# Patient Record
Sex: Male | Born: 1955
Health system: Southern US, Community
[De-identification: ages and names within clinical notes are randomized; demographics above are authoritative.]

## PROBLEM LIST (undated history)

## (undated) DIAGNOSIS — I10 Essential (primary) hypertension: Secondary | ICD-10-CM

## (undated) DIAGNOSIS — D649 Anemia, unspecified: Secondary | ICD-10-CM

## (undated) DIAGNOSIS — E119 Type 2 diabetes mellitus without complications: Secondary | ICD-10-CM

## (undated) HISTORY — DX: Essential (primary) hypertension: I10

---

## 2007-08-19 ENCOUNTER — Emergency Department (HOSPITAL_COMMUNITY): Admission: EM | Admit: 2007-08-19 | Discharge: 2007-08-19 | Payer: Self-pay | Admitting: Emergency Medicine

## 2010-02-15 ENCOUNTER — Emergency Department (HOSPITAL_COMMUNITY): Admission: EM | Admit: 2010-02-15 | Discharge: 2010-02-15 | Payer: Self-pay | Admitting: Emergency Medicine

## 2010-11-19 LAB — URINALYSIS, ROUTINE W REFLEX MICROSCOPIC
Bilirubin Urine: NEGATIVE
Glucose, UA: NEGATIVE mg/dL
Hgb urine dipstick: NEGATIVE
Ketones, ur: 15 mg/dL — AB
Leukocytes, UA: NEGATIVE
Nitrite: NEGATIVE
Protein, ur: 30 mg/dL — AB
Specific Gravity, Urine: 1.036 — ABNORMAL HIGH (ref 1.005–1.030)
Urobilinogen, UA: 0.2 mg/dL (ref 0.0–1.0)
pH: 6 (ref 5.0–8.0)

## 2010-11-19 LAB — URINE MICROSCOPIC-ADD ON

## 2012-09-29 ENCOUNTER — Encounter: Payer: Self-pay | Admitting: Dietician

## 2012-09-29 ENCOUNTER — Encounter: Payer: BC Managed Care – PPO | Attending: Internal Medicine | Admitting: Dietician

## 2012-09-29 DIAGNOSIS — Z713 Dietary counseling and surveillance: Secondary | ICD-10-CM | POA: Insufficient documentation

## 2012-09-29 DIAGNOSIS — I10 Essential (primary) hypertension: Secondary | ICD-10-CM | POA: Insufficient documentation

## 2012-09-29 DIAGNOSIS — E119 Type 2 diabetes mellitus without complications: Secondary | ICD-10-CM

## 2012-09-29 NOTE — Progress Notes (Signed)
Medical Nutrition Therapy:  Appt start time: 0800 end time:  0900.  Assessment:  Primary concerns today: Htn, DM type II.   MEDICATIONS: see list   DIETARY INTAKE:  Usual eating pattern includes 3 meals and 0-1 snacks per day.  Everyday foods include yams, plantains, chix, peanuts, oatmeal.  Avoided foods include pork, regular soda.    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 Pt states he enjoys walking very much.  Pt is [place], and has some trouble expressing his food choices, but seems to comprehend education well. He acknowledges some sweets in his diet, which he plans to remove (cookies and sweet bread). He also admits to uses a significant amount of salt in his cooking, and does eat some canned items, like corned beef hash, that are very sodium-rich. He also admits to eating very few fruits and vegetables, and has trepidations about  Eating more simply because is not aware of how to cook them, or which ones he will enjoy the taste of.  Pt states he feels very tired and sleepy right after meals, especially breakfast. He confirms c/o polyuria, thirst. He states this may be a very good time for him to begin walking.   Progress Towards Goal(s):  In progress.   Nutritional Diagnosis:  Inadequate vitamin D intake related to dark complexion, work shift during later day, inadequate dairy intake as evidenced by diet recall, 25-hydroxy Vitamin D 11.3 ng/mL.  Food and nutrition-related knowledge deficit related to DM type II and Htn eating pattern  as evidenced by poor glucose control (hgb A1c 8.4), elevated BP (174/98), and pt acknowledgement of lack of education on proper eating c these chronic illnesses.     Intervention:  Nutrition education provided regarding DM type II and Htn eating. Increased intake of nonstarchy veg emphasized, also portion control of starches and proteins. RD also discussed choosing less salty, less fatty meats, which pt easily understood. With some discussion, pt recognized some nonstarchy vegetables he enjoys and has some familiarity with.  RD discussed reduction of all sweet foods, and pt immediately stated he would cut out his "sugar bread" and cookies.  RD discussed choosing sugar free yogurt and low fat milk daily as good dairy choices, change from condensed milk to milk in the gallon or half gallon containers.  RD recommended 2-3 fruits each day, and discussed fruit choices pt would enjoy and appropriate serving sizes.  RD spoke with pt about high sodium foods, and which to eliminate, as well as avoidance of caffeine drinks. RD discussed with pt increased PA to 150 minutes per week,a nd tp stated he would enjoy walking.  Handouts given during visit include:  Plate Method  Hypertension Eating  DM diet portion sizes  Monitoring/Evaluation:  Dietary intake, exercise, portion control, and starch intake in 1 month(s).

## 2012-10-31 ENCOUNTER — Ambulatory Visit: Payer: BC Managed Care – PPO | Admitting: Dietician

## 2014-01-18 ENCOUNTER — Other Ambulatory Visit: Payer: Self-pay | Admitting: Gastroenterology

## 2014-02-11 ENCOUNTER — Encounter (HOSPITAL_COMMUNITY): Payer: Self-pay | Admitting: Pharmacy Technician

## 2014-02-11 ENCOUNTER — Encounter (HOSPITAL_COMMUNITY): Payer: Self-pay | Admitting: *Deleted

## 2014-03-02 ENCOUNTER — Encounter (HOSPITAL_COMMUNITY)
Admission: RE | Disposition: A | Discharge status: Home / Self Care | Payer: Self-pay | Source: Ambulatory Visit | Attending: Gastroenterology

## 2014-03-02 ENCOUNTER — Ambulatory Visit (HOSPITAL_COMMUNITY): Payer: BC Managed Care – PPO | Admitting: Anesthesiology

## 2014-03-02 ENCOUNTER — Ambulatory Visit (HOSPITAL_COMMUNITY)
Admission: RE | Admit: 2014-03-02 | Discharge: 2014-03-02 | Disposition: A | Discharge status: Home / Self Care | Payer: BC Managed Care – PPO | Source: Ambulatory Visit | Attending: Gastroenterology | Admitting: Gastroenterology

## 2014-03-02 ENCOUNTER — Encounter (HOSPITAL_COMMUNITY): Payer: BC Managed Care – PPO | Admitting: Anesthesiology

## 2014-03-02 ENCOUNTER — Encounter (HOSPITAL_COMMUNITY): Payer: Self-pay | Admitting: *Deleted

## 2014-03-02 DIAGNOSIS — D126 Benign neoplasm of colon, unspecified: Secondary | ICD-10-CM | POA: Insufficient documentation

## 2014-03-02 DIAGNOSIS — I10 Essential (primary) hypertension: Secondary | ICD-10-CM | POA: Diagnosis not present

## 2014-03-02 DIAGNOSIS — K921 Melena: Secondary | ICD-10-CM | POA: Insufficient documentation

## 2014-03-02 DIAGNOSIS — D649 Anemia, unspecified: Secondary | ICD-10-CM | POA: Insufficient documentation

## 2014-03-02 DIAGNOSIS — E119 Type 2 diabetes mellitus without complications: Secondary | ICD-10-CM | POA: Diagnosis not present

## 2014-03-02 HISTORY — DX: Anemia, unspecified: D64.9

## 2014-03-02 HISTORY — DX: Type 2 diabetes mellitus without complications: E11.9

## 2014-03-02 HISTORY — PX: COLONOSCOPY WITH PROPOFOL: SHX5780

## 2014-03-02 LAB — GLUCOSE, CAPILLARY: Glucose-Capillary: 93 mg/dL (ref 70–99)

## 2014-03-02 SURGERY — COLONOSCOPY WITH PROPOFOL
Anesthesia: Monitor Anesthesia Care

## 2014-03-02 MED ORDER — MIDAZOLAM HCL 2 MG/2ML IJ SOLN
INTRAMUSCULAR | Status: AC
  Filled 2014-03-02: qty 2

## 2014-03-02 MED ORDER — MIDAZOLAM HCL 5 MG/5ML IJ SOLN
INTRAMUSCULAR | Status: DC | PRN
  Administered 2014-03-02: 1 mg via INTRAVENOUS

## 2014-03-02 MED ORDER — FENTANYL CITRATE 0.05 MG/ML IJ SOLN
INTRAMUSCULAR | Status: AC
  Filled 2014-03-02: qty 2

## 2014-03-02 MED ORDER — FENTANYL CITRATE 0.05 MG/ML IJ SOLN
INTRAMUSCULAR | Status: DC | PRN
  Administered 2014-03-02 (×2): 50 ug via INTRAVENOUS

## 2014-03-02 MED ORDER — ONDANSETRON HCL 4 MG/2ML IJ SOLN
INTRAMUSCULAR | Status: DC | PRN
  Administered 2014-03-02 (×2): 2 mg via INTRAVENOUS

## 2014-03-02 MED ORDER — PROPOFOL 10 MG/ML IV BOLUS
INTRAVENOUS | Status: AC
Start: 2014-03-02 — End: 2014-03-02
  Filled 2014-03-02: qty 20

## 2014-03-02 MED ORDER — KETAMINE HCL 10 MG/ML IJ SOLN
INTRAMUSCULAR | Status: DC | PRN
  Administered 2014-03-02: 15 mg via INTRAVENOUS
  Administered 2014-03-02: 10 mg via INTRAVENOUS

## 2014-03-02 MED ORDER — SODIUM CHLORIDE 0.9 % IV SOLN: INTRAVENOUS | Status: DC

## 2014-03-02 MED ORDER — PROPOFOL INFUSION 10 MG/ML OPTIME
INTRAVENOUS | Status: DC | PRN
  Administered 2014-03-02: 100 ug/kg/min via INTRAVENOUS

## 2014-03-02 MED ORDER — LACTATED RINGERS IV SOLN
INTRAVENOUS | Status: DC | PRN
  Administered 2014-03-02: 13:00:00 via INTRAVENOUS

## 2014-03-02 MED ORDER — ONDANSETRON HCL 4 MG/2ML IJ SOLN
INTRAMUSCULAR | Status: AC
  Filled 2014-03-02: qty 2

## 2014-03-02 MED ORDER — PROPOFOL 10 MG/ML IV BOLUS
INTRAVENOUS | Status: AC
  Filled 2014-03-02: qty 20

## 2014-03-02 MED ORDER — LACTATED RINGERS IV SOLN
INTRAVENOUS | Status: DC
  Administered 2014-03-02: 1000 mL via INTRAVENOUS

## 2014-03-02 SURGICAL SUPPLY — 21 items

## 2014-03-02 NOTE — Op Note (Signed)
Problem: Intermittent hematochezia  Endoscopist: Earle Gell  Premedication: Propofol administered by anesthesia  Procedure: Diagnostic colonoscopy The patient was placed in the left lateral decubitus position. Anal inspection and digital rectal exam were normal. The Pentax pediatric colonoscope was introduced into the rectum and advanced to the cecum. A normal-appearing appendiceal orifice and ileocecal valve were identified. Colonic preparation for the exam today was good.  Rectum. Normal. Retroflexed view of the distal rectum normal  Sigmoid colon and descending colon. Normal  Splenic flexure. Normal  Transverse colon. From the mid transverse colon a 5 mm pedunculated polyp was removed with the cold snare and submitted for pathological interpretation  Hepatic flexure. Normal  Ascending colon. Normal  Cecum and ileocecal valve. Normal  Assessment:  #1. From the mid transverse colon a 5 mm pedunculated polyp was removed with the cold snare  #2. Otherwise normal diagnostic proctocolonoscopy to the cecum  Recommendation: If the mid transverse colon polyp returns neoplastic pathologically, the patient should undergo a surveillance colonoscopy in 5 years. If the polyp returns nonneoplastic pathologically, the patient should undergo a repeat screening colonoscopy in 10 years

## 2014-03-02 NOTE — Anesthesia Preprocedure Evaluation (Signed)
Anesthesia Evaluation  Patient identified by MRN, date of birth, ID band Patient awake    Reviewed: Allergy & Precautions, H&P , NPO status , Patient's Chart, lab work & pertinent test results  Airway Mallampati: II TM Distance: >3 FB Neck ROM: Full    Dental  (+) Dental Advisory Given   Pulmonary neg pulmonary ROS,  breath sounds clear to auscultation        Cardiovascular hypertension, Pt. on medications Rhythm:Regular Rate:Normal     Neuro/Psych negative neurological ROS  negative psych ROS   GI/Hepatic negative GI ROS, Neg liver ROS,   Endo/Other  diabetes, Type 2  Renal/GU negative Renal ROS     Musculoskeletal negative musculoskeletal ROS (+)   Abdominal   Peds  Hematology  (+) anemia ,   Anesthesia Other Findings   Reproductive/Obstetrics negative OB ROS                           Anesthesia Physical Anesthesia Plan  ASA: II  Anesthesia Plan: MAC   Post-op Pain Management:    Induction: Intravenous  Airway Management Planned:   Additional Equipment:   Intra-op Plan:   Post-operative Plan:   Informed Consent: I have reviewed the patients History and Physical, chart, labs and discussed the procedure including the risks, benefits and alternatives for the proposed anesthesia with the patient or authorized representative who has indicated his/her understanding and acceptance.   Dental advisory given  Plan Discussed with: CRNA  Anesthesia Plan Comments:         Anesthesia Quick Evaluation

## 2014-03-02 NOTE — Anesthesia Postprocedure Evaluation (Signed)
Anesthesia Post Note  Patient: Brian Henry  Procedure(s) Performed: Procedure(s) (LRB): COLONOSCOPY WITH PROPOFOL (N/A)  Anesthesia type: MAC  Patient location: PACU  Post pain: Pain level controlled  Post assessment: Post-op Vital signs reviewed  Last Vitals: BP 136/83  Pulse 62  Temp(Src) 36.8 C (Oral)  Resp 14  Ht 5\' 8"  (1.727 m)  Wt 185 lb (83.915 kg)  BMI 28.14 kg/m2  SpO2 100%  Post vital signs: Reviewed  Level of consciousness: awake  Complications: No apparent anesthesia complications

## 2014-03-02 NOTE — Discharge Instructions (Signed)

## 2014-03-02 NOTE — H&P (Signed)
  Procedure: Diagnostic colonoscopy to evaluate hematochezia.  History: The patient is a 58 year old male born 20-May-1956. He is undergoing a diagnostic colonoscopy to evaluate intermittent hematochezia.  Medication allergies: None  Past medical history: Hypertension. Type 2 diabetes mellitus.  Exam: The patient is alert and lying comfortably on the endoscopy stretcher. Abdomen is soft and nontender to palpation. Lungs are clear to auscultation. Cardiac exam reveals a regular rhythm  Plan: Proceed with diagnostic colonoscopy

## 2014-03-02 NOTE — Transfer of Care (Signed)
Immediate Anesthesia Transfer of Care Note  Patient: Brian Henry  Procedure(s) Performed: Procedure(s): COLONOSCOPY WITH PROPOFOL (N/A)  Patient Location: PACU  Anesthesia Type:MAC  Level of Consciousness: awake, oriented, sedated and patient cooperative  Airway & Oxygen Therapy: Patient Spontanous Breathing and Patient connected to nasal cannula oxygen  Post-op Assessment: Report given to PACU RN and Post -op Vital signs reviewed and stable  Post vital signs: stable  Complications: No apparent anesthesia complications

## 2014-03-03 ENCOUNTER — Encounter (HOSPITAL_COMMUNITY): Payer: Self-pay | Admitting: Gastroenterology

## 2016-10-09 DIAGNOSIS — I10 Essential (primary) hypertension: Secondary | ICD-10-CM | POA: Diagnosis not present

## 2016-10-09 DIAGNOSIS — R634 Abnormal weight loss: Secondary | ICD-10-CM | POA: Diagnosis not present

## 2016-10-09 DIAGNOSIS — E118 Type 2 diabetes mellitus with unspecified complications: Secondary | ICD-10-CM | POA: Diagnosis not present

## 2016-11-20 DIAGNOSIS — I10 Essential (primary) hypertension: Secondary | ICD-10-CM | POA: Diagnosis not present

## 2016-11-20 DIAGNOSIS — E118 Type 2 diabetes mellitus with unspecified complications: Secondary | ICD-10-CM | POA: Diagnosis not present

## 2016-11-20 DIAGNOSIS — R634 Abnormal weight loss: Secondary | ICD-10-CM | POA: Diagnosis not present

## 2016-11-20 DIAGNOSIS — D649 Anemia, unspecified: Secondary | ICD-10-CM | POA: Diagnosis not present

## 2016-11-22 ENCOUNTER — Encounter: Payer: Self-pay | Admitting: Physician Assistant

## 2016-11-27 ENCOUNTER — Ambulatory Visit (INDEPENDENT_AMBULATORY_CARE_PROVIDER_SITE_OTHER): Payer: BLUE CROSS/BLUE SHIELD | Admitting: Physician Assistant

## 2016-11-27 ENCOUNTER — Encounter: Payer: Self-pay | Admitting: Physician Assistant

## 2016-11-27 ENCOUNTER — Encounter (INDEPENDENT_AMBULATORY_CARE_PROVIDER_SITE_OTHER): Payer: Self-pay

## 2016-11-27 VITALS — BP 130/70 | HR 74 | Ht 70.0 in | Wt 173.0 lb

## 2016-11-27 DIAGNOSIS — K5900 Constipation, unspecified: Secondary | ICD-10-CM | POA: Diagnosis not present

## 2016-11-27 DIAGNOSIS — R1013 Epigastric pain: Secondary | ICD-10-CM

## 2016-11-27 DIAGNOSIS — K648 Other hemorrhoids: Secondary | ICD-10-CM | POA: Diagnosis not present

## 2016-11-27 DIAGNOSIS — D509 Iron deficiency anemia, unspecified: Secondary | ICD-10-CM | POA: Diagnosis not present

## 2016-11-27 DIAGNOSIS — K921 Melena: Secondary | ICD-10-CM

## 2016-11-27 MED ORDER — HYDROCORTISONE ACETATE 25 MG RE SUPP: 25.0000 mg | Freq: Two times a day (BID) | RECTAL | 0 refills | Status: AC

## 2016-11-27 MED ORDER — NA SULFATE-K SULFATE-MG SULF 17.5-3.13-1.6 GM/177ML PO SOLN
1.0000 | ORAL | 0 refills | Status: DC
Start: 2016-11-27 — End: 2017-02-06

## 2016-11-27 MED ORDER — POLYETHYLENE GLYCOL 3350 17 GM/SCOOP PO POWD: 17.0000 g | Freq: Every day | ORAL | 3 refills | Status: AC

## 2016-11-27 NOTE — Patient Instructions (Signed)
You have been scheduled for an endoscopy and colonoscopy. Please follow the written instructions given to you at your visit today. Please pick up your prep supplies at the pharmacy within the next 1-3 days. If you use inhalers (even only as needed), please bring them with you on the day of your procedure. Your physician has requested that you go to www.startemmi.com and enter the access code given to you at your visit today. This web site gives a general overview about your procedure. However, you should still follow specific instructions given to you by our office regarding your preparation for the procedure.  We have sent the following medications to your pharmacy for you to pick up at your convenience: Hydrocortisone rectal suppositories twice a day  Miralax 17 grams (1 capful) daily in juice or water.

## 2016-11-27 NOTE — Progress Notes (Signed)
Chief Complaint: IDA, rectal bleeding, abdominal pain  HPI:  Brian Henry is a 61 year old [place] man with a past medical history of diabetes, anemia and hypertension,  who was referred to me by Lucianne Lei, MD for a complaint of iron deficiency anemia, rectal bleeding and abdominal pain .     Per referring physician's notes patient was seen on 11/20/16 and described "rumbling in his stomach and producing a lot of gas". Labs were done on that day which revealed a normal CMP and a CBC with a hemoglobin of 9.9, MCV low at 75.1 and iron studies with a ferritin low at 8, iron low at 34 and percent saturation low at 8.   Patient's last colonoscopy was done for hematochezia on 03/02/14 by Dr. Wynetta Emery with a finding of a tubular adenoma with recommendations for repeat in 5 years. There was no cause for hematochezia found.   Today, the patient tells me that for the past few months when he eats anything, even "a small amount of food" he becomes extremely bloated and hears his stomach "rumbling", and has a lot of gas production. He tells me that he has chronic constipation but his bloating seems worse when he is constipated. He tells me that when he has a bowel movement he feels a "sharp pain" down there. He has chronic constipation for which he has never tried a laxative in the past. He describes having to sit sometimes up to an hour in order to produce a bowel movement. He also tells me that he has seen bright red blood with bowel movements over the past 2-3 years, this is sometimes worse with constipation, but occurs "almost every day". Seems to be quite a lot of blood filling the toilet.   Patient tells me he also has abdominal pain which is "all over", but somewhat worse in his epigastrium. He denies heartburn or reflux today.   Patient is aware that he was recently diagnosed with anemia and tells me that this has been the case in the past too, but they have never told him "what is causing it". Patient does tell  me that he also has nosebleed sometimes up to 3 times a week with "quite a lot of blood" apparently this has never been worked up either. Associated symptoms include fatigue.   Patient denies fever, chills, change in diet, weight loss, anorexia, nausea, vomiting, and, reflux or symptoms that awaken him at night.  Past Medical History:  Diagnosis Date  . Anemia   . Diabetes mellitus without complication ([place])   . Hypertension     Past Surgical History:  Procedure Laterality Date  . COLONOSCOPY WITH PROPOFOL N/A 03/02/2014   Procedure: COLONOSCOPY WITH PROPOFOL;  Surgeon: Garlan Fair, MD;  Location: [facility];  Service: Endoscopy;  Laterality: N/A;    Current Outpatient Prescriptions  Medication Sig Dispense Refill  . amLODipine (NORVASC) 5 MG tablet Take 5 mg by mouth daily.    . empagliflozin (JARDIANCE) 10 MG TABS tablet Take 10 mg by mouth daily.    . Insulin Degludec-Liraglutide (XULTOPHY) 100-3.6 UNIT-MG/ML SOPN Inject 26 Units into the skin daily.    . Iron-FA-B Cmp-C-Biot-Probiotic (FUSION PLUS) CAPS Take 1 capsule by mouth daily.    . hydrocortisone (ANUSOL-HC) 25 MG suppository Place 1 suppository (25 mg total) rectally 2 (two) times daily. 14 suppository 0  . Na Sulfate-K Sulfate-Mg Sulf (SUPREP BOWEL PREP KIT) 17.5-3.13-1.6 GM/180ML SOLN Take 1 kit by mouth as directed. 324 mL 0  No current facility-administered medications for this visit.     Allergies as of 11/27/2016  . (No Known Allergies)    Family History  Problem Relation Age of Onset  . Hypertension Father   . Diabetes Father   . Diabetes Mother   . Colon cancer Neg Hx     Social History   Social History  . Marital status: Divorced    Spouse name: N/A  . Number of children: 8  . Years of education: N/A   Occupational History  . machine opertor    Social History Main Topics  . Smoking status: Never Smoker  . Smokeless tobacco: Never Used  . Alcohol use No  . Drug use: No  . Sexual  activity: Yes   Other Topics Concern  . Not on file   Social History Narrative  . No narrative on file    Review of Systems:    Constitutional: No weight loss, fever or chills Skin: No ras Cardiovascular: No chest pain Respiratory: No SOB Gastrointestinal: See HPI and otherwise negative Genitourinary: No dysuria or change in urinary frequency Neurological: Positive for headaches Musculoskeletal: No new muscle or joint pain Hematologic: No bruising Psychiatric: No history of depression or anxiety   Physical Exam:  Vital signs: BP 130/70   Pulse 74   Ht _0  (1.778 m)   Wt 173 lb (78.5 kg)   BMI 24.82 kg/m   Constitutional:   Pleasant African American male appears to be in NAD, Well developed, Well nourished, alert and cooperative Head:  Normocephalic and atraumatic. Eyes:   PEERL, EOMI. No icterus. Conjunctiva pink. Ears:  Normal auditory acuity. Neck:  Supple Throat: Oral cavity and pharynx without inflammation, swelling or lesion.  Respiratory: Respirations even and unlabored. Lungs clear to auscultation bilaterally.   No wheezes, crackles, or rhonchi.  Cardiovascular: Normal S1, S2. No MRG. Regular rate and rhythm. No peripheral edema, cyanosis or pallor.  Gastrointestinal:  Soft, mild distention, moderate TTP generalized, somewhat worse and epigastrium with involuntary guarding. Increased bowel sounds all 4 quadrants. No appreciable masses or hepatomegaly. Rectal:  External exam: No fissure, mass or hemorrhoids;Anoscopy: Inflamed hemorrhoids in all 3 columns, no sign of bleeding, no discomfort Msk:  Symmetrical without gross deformities. Without edema, no deformity or joint abnormality.  Neurologic:  Alert and  oriented x4;  grossly normal neurologically.  Skin:   Dry and intact without significant lesions or rashes. Psychiatric:  Demonstrates good judgement and reason without abnormal affect or behaviors.  Recent labs: See HPI  Assessment: 1. Hematochezia: For  the past 2-3 years every day, worse with constipation, question relation to internal hemorrhoids versus other, colonoscopy in 2015 with 1 tubular adenoma; consider polyp versus diverticular versus AVM versus other 2. Constipation: Chronic for the patient, never been on a laxative 3. Hemorrhoids: Internal hemorrhoids inflamed seen at time of exam today, question if this is what is causing bleeding, patient also reports pain, not tender time of my exam today, question if he has a fissure that was not visualized today 4. Epigastric pain: On exam with some involuntary guarding, question gastritis versus PUD versus reflux versus other 5. IDA: Patient's hemoglobin was around 9 when recently checked, apparently this has been somewhat chronic for the patient, he does report daily rectal bleeding and does have abdominal pain, also reports nosebleeds up to 3 times a week, if our evaluation does not find an etiology may recommend ENT referral for further eval  Plan: 1. Scheduled patient for an EGD  and colonoscopy with Dr. Havery Moros, as he is supervising physician this morning, in the [facility]. Discussed risks, benefits, limitations and alternatives and the patient agrees to proceed. 2. Prescribed Hydrocortisone suppositories twice a day 7 days #14 with one refill 3. Prescribed Polyethylene glycol to be taken 1-2 times daily 4. Patient to follow in clinic per Dr. Doyne Keel recommendations after time of procedures.  Ellouise Newer, PA-C [facility] 11/27/2016, 9:24 AM  Cc: Lucianne Lei, MD

## 2016-11-27 NOTE — Progress Notes (Signed)
Agree with assessment and plan as outlined.  

## 2016-12-14 DIAGNOSIS — E118 Type 2 diabetes mellitus with unspecified complications: Secondary | ICD-10-CM | POA: Diagnosis not present

## 2016-12-14 DIAGNOSIS — D649 Anemia, unspecified: Secondary | ICD-10-CM | POA: Diagnosis not present

## 2016-12-14 DIAGNOSIS — M791 Myalgia: Secondary | ICD-10-CM | POA: Diagnosis not present

## 2016-12-14 DIAGNOSIS — I10 Essential (primary) hypertension: Secondary | ICD-10-CM | POA: Diagnosis not present

## 2017-01-11 ENCOUNTER — Encounter: Payer: BLUE CROSS/BLUE SHIELD | Admitting: Gastroenterology

## 2017-01-24 ENCOUNTER — Encounter: Payer: Self-pay | Admitting: Gastroenterology

## 2017-01-25 DIAGNOSIS — R634 Abnormal weight loss: Secondary | ICD-10-CM | POA: Diagnosis not present

## 2017-01-25 DIAGNOSIS — M791 Myalgia: Secondary | ICD-10-CM | POA: Diagnosis not present

## 2017-01-25 DIAGNOSIS — I10 Essential (primary) hypertension: Secondary | ICD-10-CM | POA: Diagnosis not present

## 2017-01-25 DIAGNOSIS — E118 Type 2 diabetes mellitus with unspecified complications: Secondary | ICD-10-CM | POA: Diagnosis not present

## 2017-02-04 ENCOUNTER — Telehealth: Payer: Self-pay | Admitting: Gastroenterology

## 2017-02-04 NOTE — Telephone Encounter (Signed)
Went over prep instructions with patient and new times. Clarified and reiterated the instructions with him multiple times. Patient expressed understanding of his procedures and his instructions.

## 2017-02-06 ENCOUNTER — Ambulatory Visit (AMBULATORY_SURGERY_CENTER): Payer: BLUE CROSS/BLUE SHIELD | Admitting: Gastroenterology

## 2017-02-06 ENCOUNTER — Encounter: Payer: Self-pay | Admitting: Gastroenterology

## 2017-02-06 VITALS — BP 138/98 | HR 74 | Temp 98.4°F | Resp 25 | Ht 70.0 in | Wt 173.0 lb

## 2017-02-06 DIAGNOSIS — D508 Other iron deficiency anemias: Secondary | ICD-10-CM | POA: Diagnosis not present

## 2017-02-06 DIAGNOSIS — K921 Melena: Secondary | ICD-10-CM | POA: Diagnosis not present

## 2017-02-06 DIAGNOSIS — K625 Hemorrhage of anus and rectum: Secondary | ICD-10-CM | POA: Diagnosis not present

## 2017-02-06 DIAGNOSIS — K648 Other hemorrhoids: Secondary | ICD-10-CM | POA: Diagnosis not present

## 2017-02-06 MED ORDER — SODIUM CHLORIDE 0.9 % IV SOLN: 500.0000 mL | INTRAVENOUS | Status: AC

## 2017-02-06 NOTE — Progress Notes (Signed)
Alert and oriented x3, pleased with MAC, report to RN  Dental precautions given

## 2017-02-06 NOTE — Op Note (Signed)
[place] Patient Name: Brian Henry Procedure Date: 02/06/2017 2:34 PM MRN: [id number] Endoscopist: Remo Lipps P. [name] MD, MD Age: 62 Referring MD:  Date of Birth: 03/22/1956 Gender: Male Account #: [id number] Procedure:                Upper GI endoscopy Indications:              Epigastric abdominal pain, Iron deficiency anemia Medicines:                Monitored Anesthesia Care Procedure:                Pre-Anesthesia Assessment:                           - Prior to the procedure, a History and Physical                            was performed, and patient medications and                            allergies were reviewed. The patient's tolerance of                            previous anesthesia was also reviewed. The risks                            and benefits of the procedure and the sedation                            options and risks were discussed with the patient.                            All questions were answered, and informed consent                            was obtained. Prior Anticoagulants: The patient has                            taken no previous anticoagulant or antiplatelet                            agents. ASA Grade Assessment: II - A patient with                            mild systemic disease. After reviewing the risks                            and benefits, the patient was deemed in                            satisfactory condition to undergo the procedure.                           After obtaining informed consent, the endoscope was  passed under direct vision. Throughout the                            procedure, the patient's blood pressure, pulse, and                            oxygen saturations were monitored continuously. The                            Endoscope was introduced through the mouth, and                            advanced to the second part of duodenum. The upper                            GI  endoscopy was accomplished without difficulty.                            The patient tolerated the procedure well. Scope In: Scope Out: Findings:                 Esophagogastric landmarks were identified: the                            Z-line was found at 40 cm, the gastroesophageal                            junction was found at 40 cm and the upper extent of                            the gastric folds was found at 40 cm from the                            incisors.                           The exam of the esophagus was otherwise normal.                           Patchy mildly erythematous mucosa was found in the                            gastric body. Biopsies were taken with a cold                            forceps for Helicobacter pylori testing.                           The exam of the stomach was otherwise normal.                           The duodenal bulb and second portion of the  duodenum were normal. Complications:            No immediate complications. Estimated blood loss:                            Minimal. Estimated Blood Loss:     Estimated blood loss was minimal. Impression:               - Esophagogastric landmarks identified.                           - Normal esophagus                           - Erythematous mucosa in the gastric body. Biopsied.                           - Normal duodenal bulb and second portion of the                            duodenum. Recommendation:           - Patient has a contact number available for                            emergencies. The signs and symptoms of potential                            delayed complications were discussed with the                            patient. Return to normal activities tomorrow.                            Written discharge instructions were provided to the                            patient.                           - Resume previous diet.                           -  Continue present medications.                           - Await pathology results.                           - Consideration for hemorrhoid banding, this could                            be related to the patient's anemia                           - Consideration for cross sectional imaging if  abdominal pain persists, pathology results without                            clear etiology Remo Lipps P. [name] MD, MD 02/06/2017 3:15:40 PM This report has been signed electronically.

## 2017-02-06 NOTE — Progress Notes (Signed)
Pt's states no medical or surgical changes since previsit or office visit. 

## 2017-02-06 NOTE — Op Note (Signed)
[facility] Patient Name: Brian Henry Procedure Date: 02/06/2017 2:33 PM MRN: [id number] Endoscopist: Remo Lipps P. Armbruster MD, MD Age: 61 Referring MD:  Date of Birth: 09/13/1955 Gender: Male Account #: [id number] Procedure:                Colonoscopy Indications:              Hematochezia, Iron deficiency anemia Medicines:                Monitored Anesthesia Care Procedure:                Pre-Anesthesia Assessment:                           - Prior to the procedure, a History and Physical                            was performed, and patient medications and                            allergies were reviewed. The patient's tolerance of                            previous anesthesia was also reviewed. The risks                            and benefits of the procedure and the sedation                            options and risks were discussed with the patient.                            All questions were answered, and informed consent                            was obtained. Prior Anticoagulants: The patient has                            taken no previous anticoagulant or antiplatelet                            agents. ASA Grade Assessment: II - A patient with                            mild systemic disease. After reviewing the risks                            and benefits, the patient was deemed in                            satisfactory condition to undergo the procedure.                           After obtaining informed consent, the colonoscope  was passed under direct vision. Throughout the                            procedure, the patient's blood pressure, pulse, and                            oxygen saturations were monitored continuously. The                            Colonoscope was introduced through the anus and                            advanced to the the terminal ileum, with                            identification of the  appendiceal orifice and IC                            valve. The colonoscopy was performed without                            difficulty. The patient tolerated the procedure                            well. The quality of the bowel preparation was                            fair. The terminal ileum, ileocecal valve,                            appendiceal orifice, and rectum were photographed. Scope In: 2:45:15 PM Scope Out: 3:05:17 PM Scope Withdrawal Time: 0 hours 13 minutes 8 seconds  Total Procedure Duration: 0 hours 20 minutes 2 seconds  Findings:                 The perianal and digital rectal examinations were                            normal.                           The terminal ileum appeared normal.                           Internal hemorrhoids were found during                            retroflexion. The hemorrhoids were moderate and                            appeared inflamed.                           A large amount of semi-liquid stool was found in  the entire colon, making visualization difficult,                            worst in the right colon. Lavage of the area was                            performed using copious amounts of sterile water,                            resulting in clearance with fair visualization.                            Small or flat polyps may not have been appreciated                            during this exam, but no obvious mass lesions or                            mucosal abnormalities appreciated.                           The exam was otherwise without abnormality. Complications:            No immediate complications. Estimated blood loss:                            None. Estimated Blood Loss:     Estimated blood loss: none. Impression:               - Preparation of the colon was fair, no large mass                            lesions but small or flat polyps may not have been                             appreciated.                           - The examined portion of the ileum was normal.                           - Inflamed internal hemorrhoids.                           - The examination was otherwise normal. Recommendation:           - Patient has a contact number available for                            emergencies. The signs and symptoms of potential                            delayed complications were discussed with the  patient. Return to normal activities tomorrow.                            Written discharge instructions were provided to the                            patient.                           - Resume previous diet.                           - Continue present medications.                           - Await EGD pathology results and course on oral                            iron with further recommendations                           - Consideration for hemorrhoid banding. I suspect                            this is the cause for the patient's rectal                            bleeding, which could be driving the anemia. Remo Lipps P. Armbruster MD, MD 02/06/2017 3:11:17 PM This report has been signed electronically.

## 2017-02-06 NOTE — Patient Instructions (Signed)
YOU HAD AN ENDOSCOPIC PROCEDURE TODAY AT [facility]:   Refer to the procedure report that was given to you for any specific questions about what was found during the examination.  If the procedure report does not answer your questions, please call your gastroenterologist to clarify.  If you requested that your care partner not be given the details of your procedure findings, then the procedure report has been included in a sealed envelope for you to review at your convenience later.  YOU SHOULD EXPECT: Some feelings of bloating in the abdomen. Passage of more gas than usual.  Walking can help get rid of the air that was put into your GI tract during the procedure and reduce the bloating. If you had a lower endoscopy (such as a colonoscopy or flexible sigmoidoscopy) you may notice spotting of blood in your stool or on the toilet paper. If you underwent a bowel prep for your procedure, you may not have a normal bowel movement for a few days.  Please Note:  You might notice some irritation and congestion in your nose or some drainage.  This is from the oxygen used during your procedure.  There is no need for concern and it should clear up in a day or so.  SYMPTOMS TO REPORT IMMEDIATELY:   Following lower endoscopy (colonoscopy or flexible sigmoidoscopy):  Excessive amounts of blood in the stool  Significant tenderness or worsening of abdominal pains  Swelling of the abdomen that is new, acute  Fever of 100F or higher   Following upper endoscopy (EGD)  Vomiting of blood or coffee ground material  New chest pain or pain under the shoulder blades  Painful or persistently difficult swallowing  New shortness of breath  Fever of 100F or higher  Black, tarry-looking stools  For urgent or emergent issues, a gastroenterologist can be reached at any hour by calling [phone].   DIET:  We do recommend a small meal at first, but then you may proceed to your regular diet.  Drink  plenty of fluids but you should avoid alcoholic beverages for 24 hours.  ACTIVITY:  You should plan to take it easy for the rest of today and you should NOT DRIVE or use heavy machinery until tomorrow (because of the sedation medicines used during the test).    FOLLOW UP: Our staff will call the number listed on your records the next business day following your procedure to check on you and address any questions or concerns that you may have regarding the information given to you following your procedure. If we do not reach you, we will leave a message.  However, if you are feeling well and you are not experiencing any problems, there is no need to return our call.  We will assume that you have returned to your regular daily activities without incident.  If any biopsies were taken you will be contacted by phone or by letter within the next 1-3 weeks.  Please call us at [phone] if you have not heard about the biopsies in 3 weeks.    SIGNATURES/CONFIDENTIALITY: You and/or your care partner have signed paperwork which will be entered into your electronic medical record.  These signatures attest to the fact that that the information above on your After Visit Summary has been reviewed and is understood.  Full responsibility of the confidentiality of this discharge information lies with you and/or your care-partner.  Hemorrhoid and hemorrhoid banding information given,

## 2017-02-06 NOTE — Progress Notes (Signed)
Called to room to assist during endoscopic procedure.  Patient ID and intended procedure confirmed with present staff. Received instructions for my participation in the procedure from the performing physician.  

## 2017-02-07 ENCOUNTER — Telehealth: Payer: Self-pay

## 2017-02-07 NOTE — Telephone Encounter (Signed)
-----   Message from Manus Gunning, MD sent at 02/06/2017  5:10 PM EDT ----- Regarding: banding Almyra Free would you mind calling this patient and scheduling him for a hemorrhoid banding? Thanks

## 2017-02-07 NOTE — Telephone Encounter (Signed)
Left message for patient to contact our office to get scheduled for the hemorrhoid banding procedure.

## 2017-02-07 NOTE — Telephone Encounter (Signed)
  Follow up Call-  Call back number 02/06/2017  Post procedure Call Back phone  # [phone]  Permission to leave phone message Yes  Some recent data might be hidden     Patient questions:  Do you have a fever, pain , or abdominal swelling? No. Pain Score  0 *  Have you tolerated food without any problems? Yes.    Have you been able to return to your normal activities? Yes.    Do you have any questions about your discharge instructions: Diet   No. Medications  No. Follow up visit  No.  Do you have questions or concerns about your Care? No.  Actions: * If pain score is 4 or above: No action needed, pain <4.

## 2017-02-11 ENCOUNTER — Telehealth: Payer: Self-pay | Admitting: Gastroenterology

## 2017-02-11 NOTE — Telephone Encounter (Signed)
Returned patient's call, he needs to be scheduled for hemorrhoid banding. Asked him to call back.

## 2017-02-13 ENCOUNTER — Other Ambulatory Visit: Payer: Self-pay

## 2017-02-13 DIAGNOSIS — D649 Anemia, unspecified: Secondary | ICD-10-CM

## 2017-03-18 ENCOUNTER — Emergency Department (HOSPITAL_COMMUNITY): Payer: BLUE CROSS/BLUE SHIELD

## 2017-03-18 ENCOUNTER — Encounter (HOSPITAL_COMMUNITY): Payer: Self-pay | Admitting: *Deleted

## 2017-03-18 ENCOUNTER — Emergency Department (HOSPITAL_COMMUNITY)
Admission: EM | Admit: 2017-03-18 | Discharge: 2017-03-18 | Disposition: A | Discharge status: Home / Self Care | Payer: BLUE CROSS/BLUE SHIELD | Attending: Emergency Medicine | Admitting: Emergency Medicine

## 2017-03-18 DIAGNOSIS — I773 Arterial fibromuscular dysplasia: Secondary | ICD-10-CM

## 2017-03-18 DIAGNOSIS — Z7982 Long term (current) use of aspirin: Secondary | ICD-10-CM | POA: Insufficient documentation

## 2017-03-18 DIAGNOSIS — I10 Essential (primary) hypertension: Secondary | ICD-10-CM | POA: Insufficient documentation

## 2017-03-18 DIAGNOSIS — I6523 Occlusion and stenosis of bilateral carotid arteries: Secondary | ICD-10-CM | POA: Diagnosis not present

## 2017-03-18 DIAGNOSIS — R51 Headache: Secondary | ICD-10-CM | POA: Diagnosis not present

## 2017-03-18 DIAGNOSIS — G510 Bell's palsy: Secondary | ICD-10-CM | POA: Insufficient documentation

## 2017-03-18 DIAGNOSIS — R29810 Facial weakness: Secondary | ICD-10-CM | POA: Diagnosis not present

## 2017-03-18 DIAGNOSIS — Z794 Long term (current) use of insulin: Secondary | ICD-10-CM | POA: Diagnosis not present

## 2017-03-18 DIAGNOSIS — E119 Type 2 diabetes mellitus without complications: Secondary | ICD-10-CM | POA: Diagnosis not present

## 2017-03-18 LAB — COMPREHENSIVE METABOLIC PANEL
ALT: 15 U/L — ABNORMAL LOW (ref 17–63)
AST: 20 U/L (ref 15–41)
Albumin: 3.5 g/dL (ref 3.5–5.0)
Alkaline Phosphatase: 51 U/L (ref 38–126)
Anion gap: 5 (ref 5–15)
BUN: 15 mg/dL (ref 6–20)
CO2: 29 mmol/L (ref 22–32)
Calcium: 9.1 mg/dL (ref 8.9–10.3)
Chloride: 104 mmol/L (ref 101–111)
Creatinine, Ser: 1.13 mg/dL (ref 0.61–1.24)
GFR calc Af Amer: >60 mL/min (ref >60)
GFR calc non Af Amer: >60 mL/min (ref >60)
Glucose, Bld: 104 mg/dL — ABNORMAL HIGH (ref 65–99)
Potassium: 3.6 mmol/L (ref 3.5–5.1)
Sodium: 138 mmol/L (ref 135–145)
Total Bilirubin: 0.5 mg/dL (ref 0.3–1.2)
Total Protein: 6.9 g/dL (ref 6.5–8.1)

## 2017-03-18 LAB — CBC WITH DIFFERENTIAL/PLATELET
Basophils Absolute: 0 10*3/uL (ref 0.0–0.1)
Basophils Relative: 0 %
Eosinophils Absolute: 0 10*3/uL (ref 0.0–0.7)
Eosinophils Relative: 1 %
HCT: 38.9 % — ABNORMAL LOW (ref 39.0–52.0)
Hemoglobin: 12 g/dL — ABNORMAL LOW (ref 13.0–17.0)
Lymphocytes Relative: 47 %
Lymphs Abs: 2.3 10*3/uL (ref 0.7–4.0)
MCH: 23.3 pg — ABNORMAL LOW (ref 26.0–34.0)
MCHC: 30.8 g/dL (ref 30.0–36.0)
MCV: 75.4 fL — ABNORMAL LOW (ref 78.0–100.0)
Monocytes Absolute: 0.4 10*3/uL (ref 0.1–1.0)
Monocytes Relative: 7 %
Neutro Abs: 2.2 10*3/uL (ref 1.7–7.7)
Neutrophils Relative %: 45 %
Platelets: 192 10*3/uL (ref 150–400)
RBC: 5.16 MIL/uL (ref 4.22–5.81)
RDW: 15.8 % — ABNORMAL HIGH (ref 11.5–15.5)
WBC: 4.9 10*3/uL (ref 4.0–10.5)

## 2017-03-18 LAB — I-STAT TROPONIN, ED: Troponin i, poc: 0 ng/mL (ref 0.00–0.08)

## 2017-03-18 LAB — I-STAT CHEM 8, ED
BUN: 18 mg/dL (ref 6–20)
Calcium, Ion: 1.25 mmol/L (ref 1.15–1.40)
Chloride: 101 mmol/L (ref 101–111)
Creatinine, Ser: 1 mg/dL (ref 0.61–1.24)
Glucose, Bld: 101 mg/dL — ABNORMAL HIGH (ref 65–99)
HCT: 40 % (ref 39.0–52.0)
Hemoglobin: 13.6 g/dL (ref 13.0–17.0)
Potassium: 3.6 mmol/L (ref 3.5–5.1)
Sodium: 142 mmol/L (ref 135–145)
TCO2: 30 mmol/L (ref 0–100)

## 2017-03-18 MED ORDER — FLUORESCEIN SODIUM 0.6 MG OP STRP
1.0000 | ORAL_STRIP | Freq: Once | OPHTHALMIC | Status: AC
  Administered 2017-03-18: 1 via OPHTHALMIC
  Filled 2017-03-18: qty 1

## 2017-03-18 MED ORDER — CARBOXYMETHYLCELLULOSE SODIUM 0.5 % OP SOLN: 2.0000 [drp] | Freq: Three times a day (TID) | OPHTHALMIC | 0 refills | Status: AC

## 2017-03-18 MED ORDER — DIPHENHYDRAMINE HCL 50 MG/ML IJ SOLN
25.0000 mg | Freq: Once | INTRAMUSCULAR | Status: AC
  Administered 2017-03-18: 25 mg via INTRAVENOUS
  Filled 2017-03-18: qty 1

## 2017-03-18 MED ORDER — ASPIRIN EC 325 MG PO TBEC: 325.0000 mg | DELAYED_RELEASE_TABLET | Freq: Every day | ORAL | 1 refills | Status: AC

## 2017-03-18 MED ORDER — IOPAMIDOL (ISOVUE-370) INJECTION 76%
INTRAVENOUS | Status: AC
  Administered 2017-03-18: 50 mL
  Filled 2017-03-18: qty 50

## 2017-03-18 MED ORDER — GATIFLOXACIN 0.5 % OP SOLN
1.0000 [drp] | Freq: Four times a day (QID) | OPHTHALMIC | Status: DC
  Administered 2017-03-18 (×2): 1 [drp] via OPHTHALMIC
  Filled 2017-03-18: qty 2.5

## 2017-03-18 MED ORDER — TETRACAINE HCL 0.5 % OP SOLN
2.0000 [drp] | Freq: Once | OPHTHALMIC | Status: AC
  Administered 2017-03-18: 2 [drp] via OPHTHALMIC
  Filled 2017-03-18: qty 4

## 2017-03-18 MED ORDER — ACYCLOVIR 400 MG PO TABS: 400.0000 mg | ORAL_TABLET | Freq: Four times a day (QID) | ORAL | 0 refills | Status: AC

## 2017-03-18 MED ORDER — METOCLOPRAMIDE HCL 5 MG/ML IJ SOLN
10.0000 mg | Freq: Once | INTRAMUSCULAR | Status: AC
  Administered 2017-03-18: 10 mg via INTRAVENOUS
  Filled 2017-03-18: qty 2

## 2017-03-18 MED ORDER — PREDNISONE 20 MG PO TABS: 60.0000 mg | ORAL_TABLET | Freq: Every day | ORAL | 0 refills | Status: AC

## 2017-03-18 NOTE — ED Triage Notes (Signed)
Pt states R back pain and neck pain and R facial droop x 1 week.  Nose is bleeding, but states hx of same since childhood.

## 2017-03-18 NOTE — ED Notes (Signed)
Pt would like to see MD.

## 2017-03-18 NOTE — ED Notes (Signed)
Pt ambulated to room from waiting room, tolerated well. 

## 2017-03-18 NOTE — ED Provider Notes (Signed)
[facility] DEPT Provider Note   CSN: [id number] Arrival date & time: 03/18/17  2122     History   Chief Complaint Chief Complaint  Patient presents with  . Facial Droop  . Neck Pain    HPI Brian Henry is a 61 y.o. male.  HPI 61 yo M with h/o DM, HTN here with one week of R sided facial droop. Pt states sx first started one week ago with mild right sided neck pain He awoke the next AM and has had right facial droop, mild difficulty speaking due to tongue feeling different This was preceded by several days of fever, fatigue, chills, URI symptoms URI sx have now improved, followed by facial droop Over past several days, right eye has become more painful so he came in - no vision changes though No numbness, no weakness No difficulty swallowing, eating, or speaking No other medical complaints No recent head trauma  Past Medical History:  Diagnosis Date  . Anemia   . Diabetes mellitus without complication ([place])   . Hypertension     Patient Active Problem List   Diagnosis Date Noted  . Diabetes mellitus ([place]) 09/29/2012  . Hypertension 09/29/2012    Past Surgical History:  Procedure Laterality Date  . COLONOSCOPY WITH PROPOFOL N/A 03/02/2014   Procedure: COLONOSCOPY WITH PROPOFOL;  Surgeon: Garlan Fair, MD;  Location: [facility]COPY;  Service: Endoscopy;  Laterality: N/A;       Home Medications    Prior to Admission medications   Medication Sig Start Date End Date Taking? Authorizing Provider  amLODipine (NORVASC) 5 MG tablet Take 5 mg by mouth daily.   Yes [provider]  empagliflozin (JARDIANCE) 10 MG TABS tablet Take 10 mg by mouth daily.   Yes [provider]  Insulin Degludec-Liraglutide (XULTOPHY) 100-3.6 UNIT-MG/ML SOPN Inject 28 Units into the skin daily.    Yes [provider]  Iron-FA-B Cmp-C-Biot-Probiotic (FUSION PLUS) CAPS Take 1 capsule by mouth daily.   Yes [provider]  ketotifen (ZADITOR) 0.025 %  ophthalmic solution Place 1 drop into both eyes 2 (two) times daily as needed (eye relief).   Yes [provider]  losartan-hydrochlorothiazide (HYZAAR) 50-12.5 MG tablet Take 1 tablet by mouth daily. 02/17/17  Yes [provider]  metFORMIN (GLUCOPHAGE) 500 MG tablet Take 500 mg by mouth daily with breakfast.   Yes [provider]  acyclovir (ZOVIRAX) 400 MG tablet Take 1 tablet (400 mg total) by mouth 4 (four) times daily. 03/18/17 03/25/17  Duffy Bruce, MD  aspirin EC 325 MG tablet Take 1 tablet (325 mg total) by mouth daily. 03/18/17   Duffy Bruce, MD  carboxymethylcellulose (REVIVE TEARS) 0.5 % SOLN Place 2 drops into the right eye 3 (three) times daily. 03/18/17   Duffy Bruce, MD  hydrocortisone (ANUSOL-HC) 25 MG suppository Place 1 suppository (25 mg total) rectally 2 (two) times daily. Patient not taking: Reported on 03/18/2017 11/27/16   Levin Erp, PA  polyethylene glycol powder (GLYCOLAX/MIRALAX) powder Take 17 g by mouth daily. Patient not taking: Reported on 03/18/2017 11/27/16   Levin Erp, PA  predniSONE (DELTASONE) 20 MG tablet Take 3 tablets (60 mg total) by mouth daily. 03/18/17 03/25/17  Duffy Bruce, MD    Family History Family History  Problem Relation Age of Onset  . Hypertension Father   . Diabetes Father   . Diabetes Mother   . Colon cancer Neg Hx     Social History Social History  Substance  Use Topics  . Smoking status: Never Smoker  . Smokeless tobacco: Never Used  . Alcohol use No     Allergies   Patient has no known allergies.   Review of Systems Review of Systems  Neurological: Positive for facial asymmetry and headaches.  All other systems reviewed and are negative.    Physical Exam Updated Vital Signs BP (!) 161/97 (BP Location: Right Arm)   Pulse 81   Temp 98 F (36.7 C) (Oral)   Resp 18   Ht 5\' 11"  (1.803 m)   Wt 81.6 kg (180 lb)   SpO2 99%   BMI 25.10 kg/m   Physical Exam    Constitutional: He is oriented to person, place, and time. He appears well-developed and well-nourished. No distress.  HENT:  Head: Normocephalic and atraumatic.  Eyes: Conjunctivae are normal.  Mild R sided conjunctival injection. PERRL. No visible ulcerations. No signs of globe rupture. EOM painless and intact.  Neck: Neck supple.  Cardiovascular: Normal rate, regular rhythm and normal heart sounds.  Exam reveals no friction rub.   No murmur heard. Pulmonary/Chest: Effort normal and breath sounds normal. No respiratory distress. He has no wheezes. He has no rales.  Abdominal: He exhibits no distension.  Musculoskeletal: He exhibits no edema.  Neurological: He is alert and oriented to person, place, and time. He exhibits normal muscle tone.  Skin: Skin is warm. Capillary refill takes less than 2 seconds.  Psychiatric: He has a normal mood and affect.  Nursing note and vitals reviewed.   Neurological Exam:  Mental Status: Alert and oriented to person, place, and time. Attention and concentration normal. Speech clear. Recent memory is intact.   Cranial Nerves: Visual fields grossly intact. EOMI and PERRLA. No nystagmus noted. Facial sensation intact at forehead, maxillary cheek, and chin/mandible bilaterally. Complete paresis of right face involving forehead. Hearing grossly normal. Uvula is midline, and palate elevates symmetrically. Normal SCM and trapezius strength. Tongue midline without fasciculations. Motor: Muscle strength 5/5 in proximal and distal UE and LE bilaterally. No pronator drift. Muscle tone normal. Reflexes: 2+ and symmetrical in all four extremities.  Sensation: Intact to light touch in upper and lower extremities distally bilaterally.  Gait: Normal without ataxia. Coordination: Normal FTN bilaterally.    ED Treatments / Results  Labs (all labs ordered are listed, but only abnormal results are displayed) Labs Reviewed  CBC WITH DIFFERENTIAL/PLATELET - Abnormal;  Notable for the following:       Result Value   Hemoglobin 12.0 (*)    HCT 38.9 (*)    MCV 75.4 (*)    MCH 23.3 (*)    RDW 15.8 (*)    All other components within normal limits  COMPREHENSIVE METABOLIC PANEL - Abnormal; Notable for the following:    Glucose, Bld 104 (*)    ALT 15 (*)    All other components within normal limits  I-STAT CHEM 8, ED - Abnormal; Notable for the following:    Glucose, Bld 101 (*)    All other components within normal limits  I-STAT TROPOININ, ED    EKG  EKG Interpretation  Date/Time:  Monday March 18 2017 09:42:45 EDT Ventricular Rate:  75 PR Interval:    QRS Duration: 84 QT Interval:  354 QTC Calculation: 396 R Axis:   75 Text Interpretation:  Sinus rhythm Borderline prolonged PR interval Borderline T abnormalities, inferior leads Since last EKG, there are TWi in inferior leads Confirmed by Duffy Bruce [phone]) on 03/18/2017 9:57:50 AM  Radiology Ct Angio Head W Or Wo Contrast  Result Date: 03/18/2017 CLINICAL DATA:  Right-sided headache. Left-sided facial droop for 1 week. EXAM: CT ANGIOGRAPHY HEAD AND NECK TECHNIQUE: Multidetector CT imaging of the head and neck was performed using the standard protocol during bolus administration of intravenous contrast. Multiplanar CT image reconstructions and MIPs were obtained to evaluate the vascular anatomy. Carotid stenosis measurements (when applicable) are obtained utilizing NASCET criteria, using the distal internal carotid diameter as the denominator. CONTRAST:  50 mL Isovue 370 COMPARISON:  MRI brain 03/18/2017 FINDINGS: CT HEAD FINDINGS Brain: Extensive white matter disease is again noted without significant interval change. No acute cortical infarct or hemorrhage is present. A remote lacunar infarct of the left basal ganglia is stable. The brainstem and cerebellum are normal. Vascular: Mild atherosclerotic changes are present within the cavernous internal carotid arteries. There is no hyperdense  vessel. Skull: The calvarium is intact. Focal lytic or blastic lesions are present. Sinuses: Minimal mucosal thickening is noted along the inferior left maxillary sinus. The remaining paranasal sinuses and the mastoid air cells are clear. Orbits: The globes and orbits are within normal limits. Review of the MIP images confirms the above findings CTA NECK FINDINGS Aortic arch: A 3 vessel arch configuration is present. Minimal atherosclerotic calcifications are present at the arch. There is no significant stenosis or aneurysm. Right carotid system: The right common carotid artery is within normal limits. Atherosclerotic changes are present at proximal right internal carotid artery without a significant stenosis. There is an irregular beaded appearance of the mid right cervical ICA with marked tortuosity just beyond this point. A focal lucency crosses the lumen suggesting prior dissection. The vessel remains patent. Left carotid system: The left common carotid artery is within normal limits. The left carotid bifurcation is within normal limits. Moderate tortuosity is noted at the distal cervical left ICA without significant stenosis. Vertebral arteries: The vertebral arteries originate from the subclavian arteries. There is a high-grade stenosis at the origin of the non dominant left vertebral artery. The dominant right vertebral artery is within normal limits throughout the neck. Skeleton: Vertebral body heights and alignment are maintained. No focal lytic or blastic lesions are present. Other neck: The soft tissues of the neck are otherwise unremarkable. No focal mucosal or submucosal lesions are present. The salivary glands are intact. The thyroid is normal. No significant adenopathy is present. Upper chest: Minimal ground-glass attenuation is present at the right upper lobe. The lung apices are otherwise clear. The superior mediastinum is within normal limits. Review of the MIP images confirms the above findings  CTA HEAD FINDINGS Anterior circulation: The internal carotid arteries are within normal limits from the skullbase through the ICA termini bilaterally. The A1 and M1 segments are normal. The MCA bifurcations are intact. ACA and MCA branch vessels are within normal limits. Posterior circulation: The right vertebral artery is dominant. PICA origins are visualized and normal bilaterally. The vertebrobasilar junction is normal. The basilar artery is within normal limits. Both posterior cerebral arteries originate from the basilar tip. The PCA branch vessels are within normal limits. Venous sinuses: The dural sinuses are patent. Straight sinus and deep cerebral veins are intact. The right transverse sinus is dominant. Cortical veins are within normal limits. Anatomic variants: None. Delayed phase: Postcontrast images demonstrate no pathologic enhancement. Review of the MIP images confirms the above findings IMPRESSION: 1. Focal linear lucency in the mid cervical right internal carotid artery adjacent to a beaded appearance suggesting FMD and previous dissection.  The lumen is preserved. There is no significant stenosis. This could be a source of emboli. 2. Moderate tortuosity of the cervical internal carotid arteries bilaterally. This is likely related to chronic hypertension. 3. Normal CTA circle of Willis without significant proximal stenosis, aneurysm, or branch vessel occlusion. Electronically Signed   By: San Morelle M.D.   On: 03/18/2017 13:37   Ct Angio Neck W And/or Wo Contrast  Result Date: 03/18/2017 CLINICAL DATA:  Right-sided headache. Left-sided facial droop for 1 week. EXAM: CT ANGIOGRAPHY HEAD AND NECK TECHNIQUE: Multidetector CT imaging of the head and neck was performed using the standard protocol during bolus administration of intravenous contrast. Multiplanar CT image reconstructions and MIPs were obtained to evaluate the vascular anatomy. Carotid stenosis measurements (when applicable) are  obtained utilizing NASCET criteria, using the distal internal carotid diameter as the denominator. CONTRAST:  50 mL Isovue 370 COMPARISON:  MRI brain 03/18/2017 FINDINGS: CT HEAD FINDINGS Brain: Extensive white matter disease is again noted without significant interval change. No acute cortical infarct or hemorrhage is present. A remote lacunar infarct of the left basal ganglia is stable. The brainstem and cerebellum are normal. Vascular: Mild atherosclerotic changes are present within the cavernous internal carotid arteries. There is no hyperdense vessel. Skull: The calvarium is intact. Focal lytic or blastic lesions are present. Sinuses: Minimal mucosal thickening is noted along the inferior left maxillary sinus. The remaining paranasal sinuses and the mastoid air cells are clear. Orbits: The globes and orbits are within normal limits. Review of the MIP images confirms the above findings CTA NECK FINDINGS Aortic arch: A 3 vessel arch configuration is present. Minimal atherosclerotic calcifications are present at the arch. There is no significant stenosis or aneurysm. Right carotid system: The right common carotid artery is within normal limits. Atherosclerotic changes are present at proximal right internal carotid artery without a significant stenosis. There is an irregular beaded appearance of the mid right cervical ICA with marked tortuosity just beyond this point. A focal lucency crosses the lumen suggesting prior dissection. The vessel remains patent. Left carotid system: The left common carotid artery is within normal limits. The left carotid bifurcation is within normal limits. Moderate tortuosity is noted at the distal cervical left ICA without significant stenosis. Vertebral arteries: The vertebral arteries originate from the subclavian arteries. There is a high-grade stenosis at the origin of the non dominant left vertebral artery. The dominant right vertebral artery is within normal limits throughout the  neck. Skeleton: Vertebral body heights and alignment are maintained. No focal lytic or blastic lesions are present. Other neck: The soft tissues of the neck are otherwise unremarkable. No focal mucosal or submucosal lesions are present. The salivary glands are intact. The thyroid is normal. No significant adenopathy is present. Upper chest: Minimal ground-glass attenuation is present at the right upper lobe. The lung apices are otherwise clear. The superior mediastinum is within normal limits. Review of the MIP images confirms the above findings CTA HEAD FINDINGS Anterior circulation: The internal carotid arteries are within normal limits from the skullbase through the ICA termini bilaterally. The A1 and M1 segments are normal. The MCA bifurcations are intact. ACA and MCA branch vessels are within normal limits. Posterior circulation: The right vertebral artery is dominant. PICA origins are visualized and normal bilaterally. The vertebrobasilar junction is normal. The basilar artery is within normal limits. Both posterior cerebral arteries originate from the basilar tip. The PCA branch vessels are within normal limits. Venous sinuses: The dural sinuses are patent.  Straight sinus and deep cerebral veins are intact. The right transverse sinus is dominant. Cortical veins are within normal limits. Anatomic variants: None. Delayed phase: Postcontrast images demonstrate no pathologic enhancement. Review of the MIP images confirms the above findings IMPRESSION: 1. Focal linear lucency in the mid cervical right internal carotid artery adjacent to a beaded appearance suggesting FMD and previous dissection. The lumen is preserved. There is no significant stenosis. This could be a source of emboli. 2. Moderate tortuosity of the cervical internal carotid arteries bilaterally. This is likely related to chronic hypertension. 3. Normal CTA circle of Willis without significant proximal stenosis, aneurysm, or branch vessel  occlusion. Electronically Signed   By: San Morelle M.D.   On: 03/18/2017 13:37   Mr Brain Wo Contrast  Result Date: 03/18/2017 CLINICAL DATA:  Right neck pain, facial droop EXAM: MRI HEAD WITHOUT CONTRAST TECHNIQUE: Multiplanar, multiecho pulse sequences of the brain and surrounding structures were obtained without intravenous contrast. COMPARISON:  None. FINDINGS: Brain: Negative for acute infarct. Moderate chronic ischemic change throughout the white matter. Chronic hemorrhagic infarct in the left basal ganglia. Negative for mass lesion. Ventricle size normal.  Cerebral volume normal. Vascular: Normal arterial flow voids. Skull and upper cervical spine: Negative Sinuses/Orbits: Mild mucosal edema paranasal sinuses.  Normal orbit. Other: None IMPRESSION: No acute intracranial abnormality. Chronic microvascular ischemia. Chronic hemorrhagic infarction left basal ganglia. Electronically Signed   By: Franchot Gallo M.D.   On: 03/18/2017 11:43    Procedures Procedures (including critical care time)  Medications Ordered in ED Medications  gatifloxacin (ZYMAXID) 0.5 % ophthalmic drops 1 drop (1 drop Right Eye Given 03/18/17 1408)  fluorescein ophthalmic strip 1 strip (1 strip Right Eye Given 03/18/17 1027)  tetracaine (PONTOCAINE) 0.5 % ophthalmic solution 2 drop (2 drops Right Eye Given 03/18/17 1026)  metoCLOPramide (REGLAN) injection 10 mg (10 mg Intravenous Given 03/18/17 1027)  diphenhydrAMINE (BENADRYL) injection 25 mg (25 mg Intravenous Given 03/18/17 1027)  iopamidol (ISOVUE-370) 76 % injection (50 mLs  Contrast Given 03/18/17 1256)     Initial Impression / Assessment and Plan / ED Course  I have reviewed the triage vital signs and the nursing notes.  Pertinent labs & imaging results that were available during my care of the patient were reviewed by me and considered in my medical decision making (see chart for details).     61 yo M with no significant PMHx here with right-sided  facial droop. Exam is highly c/w Bell's palsy, and pt had appropriate prodrome of viral illness. His eye pain is likely 2/2 mild conjunctival abrasions but no evidence of ulceration or globe rupture. Eye patch placed with fluoroquinolone drops and artificial tears. While exam is highly c/w Bell's, however, the pt does have some atypical historical elements including neck pain, possible tongue/difficulty speaking. CT Angio and MRI subsequently obtained to eval for CVA, mass/lesion, carotid abnormality.  CT Angio shows possible old dissection/FMD. I discussed with Dr. Arnoldo Morale of [place] and Dr. Leonel Ramsay of Neurology - no acute intervention indicated and they feel like this is not likely related to his presentation. Dr. Leonel Ramsay recommends ASA 325 and tx for Bell's with outpt follow-up.  Discussed this w/ pt who is in agreement. Will treat with steroids, acyclovir, ASA and d/c with outpt follow-up. Repeat exam stable, no new neuro deficits.  Final Clinical Impressions(s) / ED Diagnoses   Final diagnoses:  Bell's palsy  Fibromuscular hyperplasia of right carotid artery [facility])    New Prescriptions Discharge Medication List as  of 03/18/2017  2:26 PM    START taking these medications   Details  acyclovir (ZOVIRAX) 400 MG tablet Take 1 tablet (400 mg total) by mouth 4 (four) times daily., Starting Mon 03/18/2017, Until Mon 03/25/2017, Print    carboxymethylcellulose (REVIVE TEARS) 0.5 % SOLN Place 2 drops into the right eye 3 (three) times daily., Starting Mon 03/18/2017, Print    predniSONE (DELTASONE) 20 MG tablet Take 3 tablets (60 mg total) by mouth daily., Starting Mon 03/18/2017, Until Mon 03/25/2017, Print         Duffy Bruce, MD 03/18/17 [phone]

## 2017-03-18 NOTE — ED Notes (Signed)
Pt back from MRI. Placed on monitor.

## 2017-03-18 NOTE — Discharge Instructions (Signed)
-   TAKE ONE FULL ASPIRIN DAILY, AS PRESCRIBED - FOR YOUR BELL'S (FACIAL) PALSY, TAKE THE PILLS AS PRESCRIBED. IT IS VERY IMPORTANT THAT YOU KEEP YOUR EYE PROTECTED WITH EYE DROPS, ANTIBIOTIC DROPS (THREE TIMES A DAY FOR ONE WEEK), AND AN EYE SHIELD AT NIGHT. FOLLOW-UP WITH AN EYE DOCTOR IN 2 DAYS - IF YOU HAVE ANY WORSENING EYE PAIN, RETURN TO THE ER OR CALL YOUR EYE DOCTOR

## 2017-03-29 DIAGNOSIS — G510 Bell's palsy: Secondary | ICD-10-CM | POA: Diagnosis not present

## 2017-03-29 DIAGNOSIS — I10 Essential (primary) hypertension: Secondary | ICD-10-CM | POA: Diagnosis not present

## 2017-03-29 DIAGNOSIS — D649 Anemia, unspecified: Secondary | ICD-10-CM | POA: Diagnosis not present

## 2017-03-29 DIAGNOSIS — E118 Type 2 diabetes mellitus with unspecified complications: Secondary | ICD-10-CM | POA: Diagnosis not present

## 2017-05-10 DIAGNOSIS — E118 Type 2 diabetes mellitus with unspecified complications: Secondary | ICD-10-CM | POA: Diagnosis not present

## 2017-05-10 DIAGNOSIS — G510 Bell's palsy: Secondary | ICD-10-CM | POA: Diagnosis not present

## 2017-05-10 DIAGNOSIS — D649 Anemia, unspecified: Secondary | ICD-10-CM | POA: Diagnosis not present

## 2017-05-10 DIAGNOSIS — I10 Essential (primary) hypertension: Secondary | ICD-10-CM | POA: Diagnosis not present

## 2017-06-24 DIAGNOSIS — G510 Bell's palsy: Secondary | ICD-10-CM | POA: Diagnosis not present

## 2017-06-24 DIAGNOSIS — D649 Anemia, unspecified: Secondary | ICD-10-CM | POA: Diagnosis not present

## 2017-06-24 DIAGNOSIS — I10 Essential (primary) hypertension: Secondary | ICD-10-CM | POA: Diagnosis not present

## 2017-06-24 DIAGNOSIS — E118 Type 2 diabetes mellitus with unspecified complications: Secondary | ICD-10-CM | POA: Diagnosis not present

## 2017-08-23 DIAGNOSIS — I10 Essential (primary) hypertension: Secondary | ICD-10-CM | POA: Diagnosis not present

## 2017-08-23 DIAGNOSIS — E118 Type 2 diabetes mellitus with unspecified complications: Secondary | ICD-10-CM | POA: Diagnosis not present

## 2017-08-23 DIAGNOSIS — G510 Bell's palsy: Secondary | ICD-10-CM | POA: Diagnosis not present

## 2017-11-22 DIAGNOSIS — I10 Essential (primary) hypertension: Secondary | ICD-10-CM | POA: Diagnosis not present

## 2017-11-22 DIAGNOSIS — G478 Other sleep disorders: Secondary | ICD-10-CM | POA: Diagnosis not present

## 2017-11-22 DIAGNOSIS — E119 Type 2 diabetes mellitus without complications: Secondary | ICD-10-CM | POA: Diagnosis not present

## 2018-03-03 DIAGNOSIS — Z09 Encounter for follow-up examination after completed treatment for conditions other than malignant neoplasm: Secondary | ICD-10-CM | POA: Diagnosis not present

## 2018-03-28 DIAGNOSIS — E118 Type 2 diabetes mellitus with unspecified complications: Secondary | ICD-10-CM | POA: Diagnosis not present

## 2018-03-28 DIAGNOSIS — Z6829 Body mass index (BMI) 29.0-29.9, adult: Secondary | ICD-10-CM | POA: Diagnosis not present

## 2018-03-28 DIAGNOSIS — D649 Anemia, unspecified: Secondary | ICD-10-CM | POA: Diagnosis not present

## 2018-03-28 DIAGNOSIS — I10 Essential (primary) hypertension: Secondary | ICD-10-CM | POA: Diagnosis not present

## 2018-04-15 DIAGNOSIS — H02421 Myogenic ptosis of right eyelid: Secondary | ICD-10-CM | POA: Diagnosis not present

## 2018-04-21 DIAGNOSIS — H02421 Myogenic ptosis of right eyelid: Secondary | ICD-10-CM | POA: Diagnosis not present

## 2018-04-21 DIAGNOSIS — Z01818 Encounter for other preprocedural examination: Secondary | ICD-10-CM | POA: Diagnosis not present

## 2018-07-29 DIAGNOSIS — Z6829 Body mass index (BMI) 29.0-29.9, adult: Secondary | ICD-10-CM | POA: Diagnosis not present

## 2018-07-29 DIAGNOSIS — I10 Essential (primary) hypertension: Secondary | ICD-10-CM | POA: Diagnosis not present

## 2018-07-29 DIAGNOSIS — E1100 Type 2 diabetes mellitus with hyperosmolarity without nonketotic hyperglycemic-hyperosmolar coma (NKHHC): Secondary | ICD-10-CM | POA: Diagnosis not present

## 2018-11-26 DIAGNOSIS — I10 Essential (primary) hypertension: Secondary | ICD-10-CM | POA: Diagnosis not present

## 2018-11-26 DIAGNOSIS — D649 Anemia, unspecified: Secondary | ICD-10-CM | POA: Diagnosis not present

## 2018-11-26 DIAGNOSIS — E1169 Type 2 diabetes mellitus with other specified complication: Secondary | ICD-10-CM | POA: Diagnosis not present

## 2018-11-26 DIAGNOSIS — K582 Mixed irritable bowel syndrome: Secondary | ICD-10-CM | POA: Diagnosis not present

## 2018-12-25 IMAGING — MR MR HEAD W/O CM
9 of 10 series · 36 of 48 positions shown · non-contrast
Comparison: None.

CLINICAL DATA: Right neck pain, facial droop

EXAM:
MRI HEAD WITHOUT CONTRAST
TECHNIQUE: Multiplanar, multiecho pulse sequences of the brain and surrounding
structures were obtained without intravenous contrast.

[Series 3: DWI · axial · 3.0mm · 1.09mm/px · z∈[-68,+76]mm · 9 of 98 slices shown (1 of 4)]
[im 1/98]
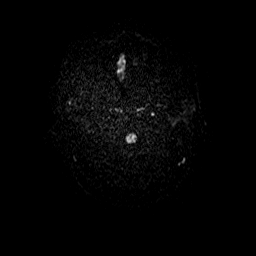
[im 13/98]
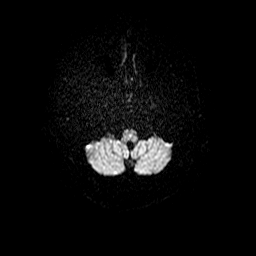
[im 25/98]
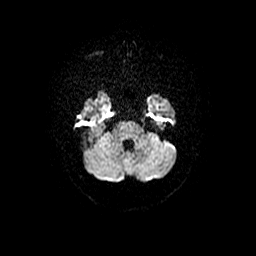
[im 37/98]
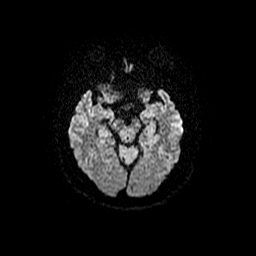
[im 49/98]
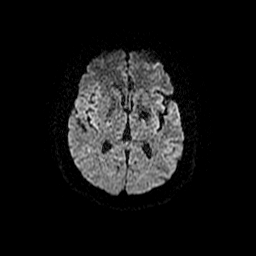
[im 61/98]
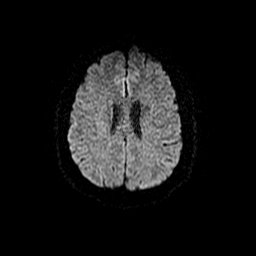
[im 73/98]
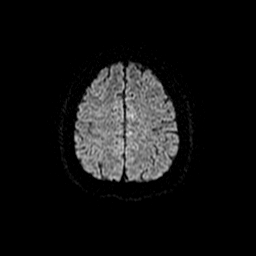
[im 85/98]
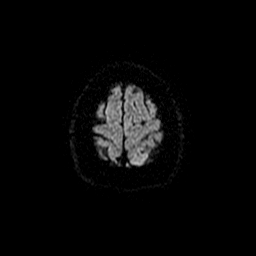
[im 98/98]
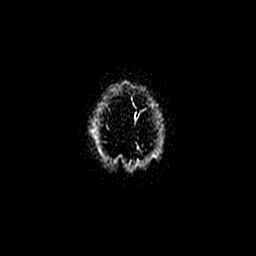

[Series 4: T1 · sagittal · 5.0mm · 0.47mm/px · 2 of 23 slices shown]
[im 1/23]
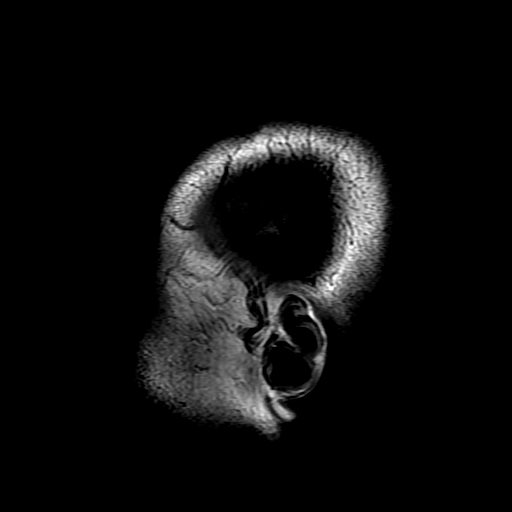
[im 23/23]
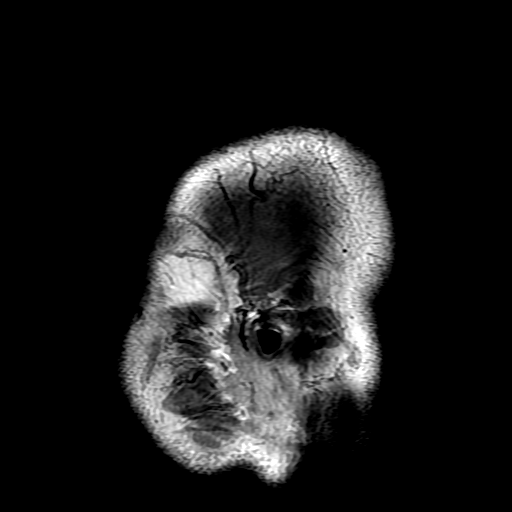

[Series 5: DWI · coronal · 5.0mm · 1.09mm/px · 7 of 66 slices shown (2 of 4)]
[im 1/66]
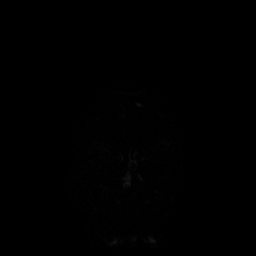
[im 11/66]
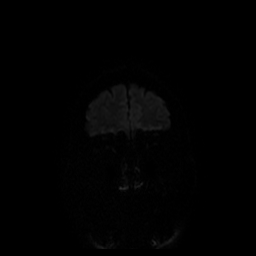
[im 22/66]
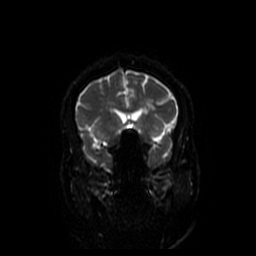
[im 33/66]
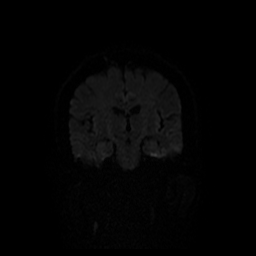
[im 44/66]
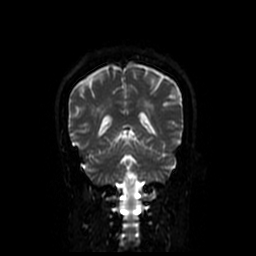
[im 55/66]
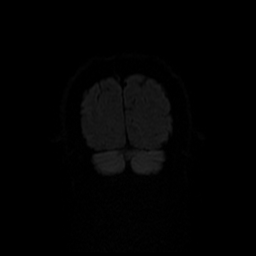
[im 66/66]
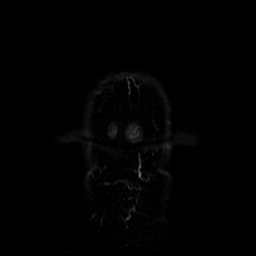

[Series 6: T2 · axial · 5.0mm · 0.43mm/px · z∈[-72,+77]mm · 3 of 26 slices shown (1 of 2)]
[im 1/26]
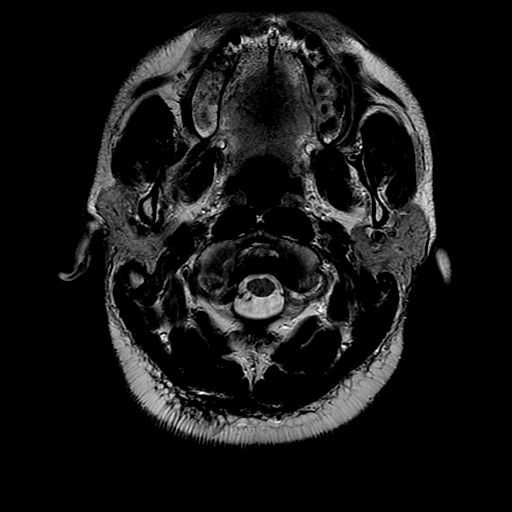
[im 13/26]
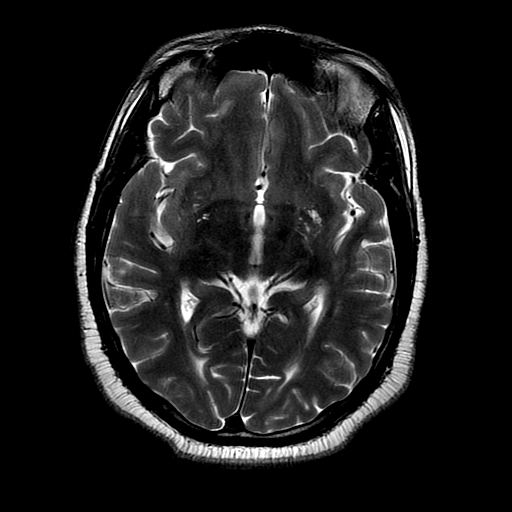
[im 26/26]
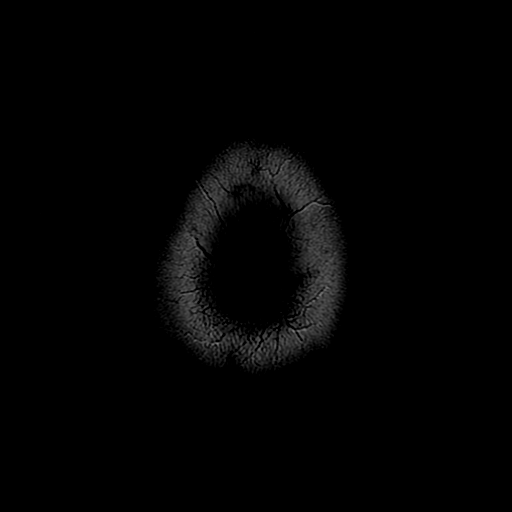

[Series 7: FLAIR · axial · 5.0mm · 0.43mm/px · z∈[-72,+77]mm · 3 of 26 slices shown]
[im 1/26]
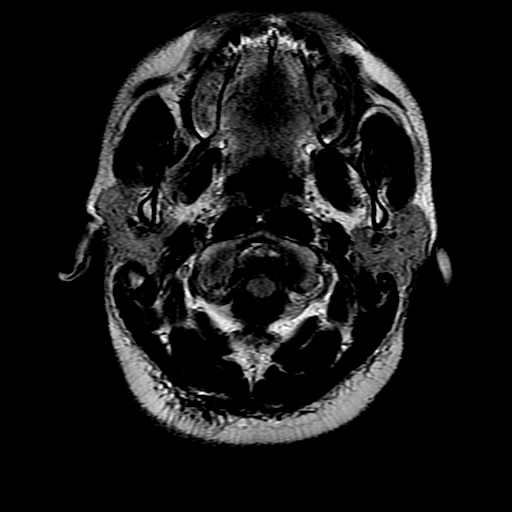
[im 13/26]
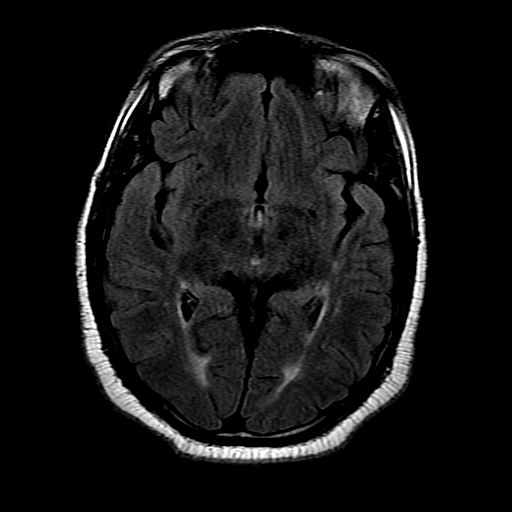
[im 26/26]
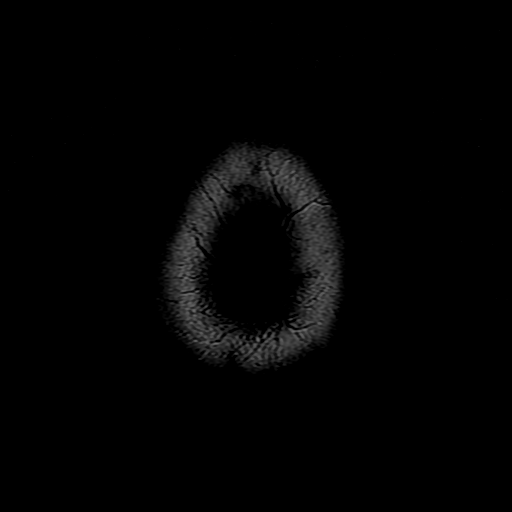

[Series 8: ax mpgr · axial · 5.0mm · 0.43mm/px · 1 of 26 slices shown]
[im 1/26]
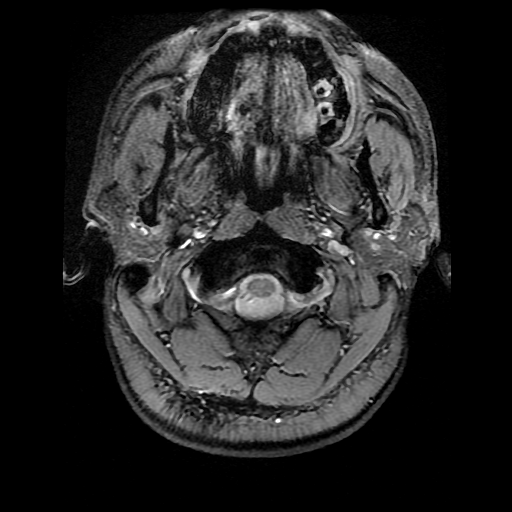

[Series 10: T2 · coronal · 5.0mm · 0.43mm/px · 3 of 27 slices shown (2 of 2)]
[im 1/27]
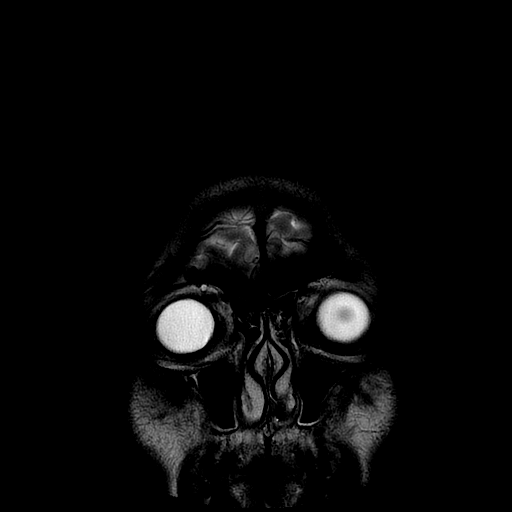
[im 14/27]
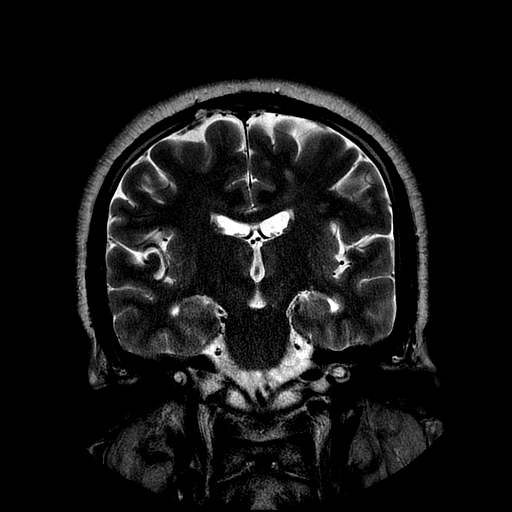
[im 27/27]
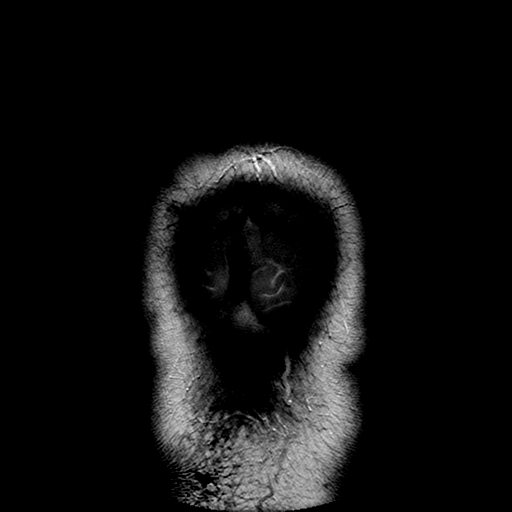

[Series 300: DWI · axial · 3.0mm · 1.09mm/px · z∈[-68,+76]mm · 5 of 49 slices shown (3 of 4)]
[im 1/49]
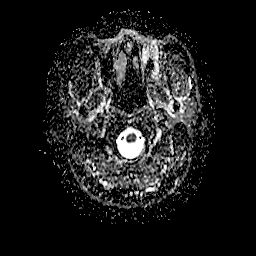
[im 13/49]
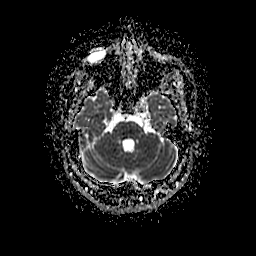
[im 25/49]
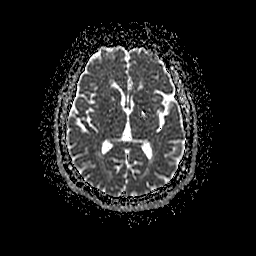
[im 37/49]
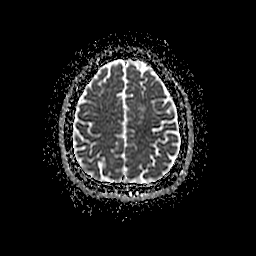
[im 49/49]
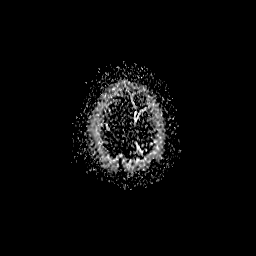

[Series 500: DWI · coronal · 5.0mm · 1.09mm/px · 3 of 33 slices shown (4 of 4)]
[im 1/33]
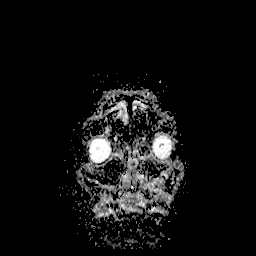
[im 17/33]
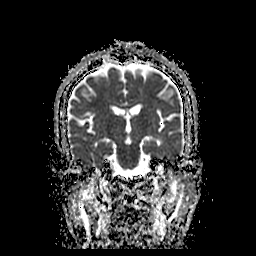
[im 33/33]
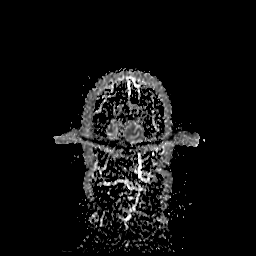

[36 of 48 positions shown; findings below may reference images not displayed]

FINDINGS: Brain: Negative for acute infarct. Moderate chronic ischemic change
throughout the white matter. Chronic hemorrhagic infarct in the left
basal ganglia. Negative for mass lesion.

Ventricle size normal.  Cerebral volume normal.

Vascular: Normal arterial flow voids.

Skull and upper cervical spine: Negative

Sinuses/Orbits: Mild mucosal edema paranasal sinuses.  Normal orbit.

Other: None
IMPRESSION: No acute intracranial abnormality. Chronic microvascular ischemia.
Chronic hemorrhagic infarction left basal ganglia.

## 2019-02-19 ENCOUNTER — Encounter: Payer: Self-pay | Admitting: Gastroenterology

## 2019-03-31 DIAGNOSIS — E1169 Type 2 diabetes mellitus with other specified complication: Secondary | ICD-10-CM | POA: Diagnosis not present

## 2019-03-31 DIAGNOSIS — I10 Essential (primary) hypertension: Secondary | ICD-10-CM | POA: Diagnosis not present

## 2019-03-31 DIAGNOSIS — D649 Anemia, unspecified: Secondary | ICD-10-CM | POA: Diagnosis not present

## 2019-08-03 DIAGNOSIS — Z6828 Body mass index (BMI) 28.0-28.9, adult: Secondary | ICD-10-CM | POA: Diagnosis not present

## 2019-08-03 DIAGNOSIS — E1169 Type 2 diabetes mellitus with other specified complication: Secondary | ICD-10-CM | POA: Diagnosis not present

## 2019-08-03 DIAGNOSIS — I10 Essential (primary) hypertension: Secondary | ICD-10-CM | POA: Diagnosis not present

## 2019-11-23 DIAGNOSIS — G478 Other sleep disorders: Secondary | ICD-10-CM | POA: Diagnosis not present

## 2019-11-23 DIAGNOSIS — Z6828 Body mass index (BMI) 28.0-28.9, adult: Secondary | ICD-10-CM | POA: Diagnosis not present

## 2019-11-23 DIAGNOSIS — E1169 Type 2 diabetes mellitus with other specified complication: Secondary | ICD-10-CM | POA: Diagnosis not present

## 2019-11-23 DIAGNOSIS — I10 Essential (primary) hypertension: Secondary | ICD-10-CM | POA: Diagnosis not present

## 2020-03-28 DIAGNOSIS — E1169 Type 2 diabetes mellitus with other specified complication: Secondary | ICD-10-CM | POA: Diagnosis not present

## 2020-03-28 DIAGNOSIS — Z6828 Body mass index (BMI) 28.0-28.9, adult: Secondary | ICD-10-CM | POA: Diagnosis not present

## 2020-03-28 DIAGNOSIS — I10 Essential (primary) hypertension: Secondary | ICD-10-CM | POA: Diagnosis not present

## 2020-08-01 DIAGNOSIS — Z125 Encounter for screening for malignant neoplasm of prostate: Secondary | ICD-10-CM | POA: Diagnosis not present

## 2020-08-01 DIAGNOSIS — G510 Bell's palsy: Secondary | ICD-10-CM | POA: Diagnosis not present

## 2020-08-01 DIAGNOSIS — I10 Essential (primary) hypertension: Secondary | ICD-10-CM | POA: Diagnosis not present

## 2020-08-01 DIAGNOSIS — E1169 Type 2 diabetes mellitus with other specified complication: Secondary | ICD-10-CM | POA: Diagnosis not present

## 2020-08-31 ENCOUNTER — Other Ambulatory Visit: Payer: Self-pay

## 2020-08-31 ENCOUNTER — Ambulatory Visit: Payer: BC Managed Care – PPO | Admitting: Neurology

## 2020-08-31 ENCOUNTER — Encounter: Payer: Self-pay | Admitting: Neurology

## 2020-08-31 VITALS — BP 141/83 | HR 89 | Ht 70.0 in | Wt 211.0 lb

## 2020-08-31 DIAGNOSIS — I6990 Unspecified sequelae of unspecified cerebrovascular disease: Secondary | ICD-10-CM | POA: Diagnosis not present

## 2020-08-31 DIAGNOSIS — G510 Bell's palsy: Secondary | ICD-10-CM

## 2020-08-31 DIAGNOSIS — I6300 Cerebral infarction due to thrombosis of unspecified precerebral artery: Secondary | ICD-10-CM

## 2020-08-31 DIAGNOSIS — R799 Abnormal finding of blood chemistry, unspecified: Secondary | ICD-10-CM | POA: Diagnosis not present

## 2020-08-31 DIAGNOSIS — E232 Diabetes insipidus: Secondary | ICD-10-CM | POA: Diagnosis not present

## 2020-08-31 NOTE — Progress Notes (Signed)
[facility] [address] [place]. [place] [address] [phone]       OFFICE CONSULT NOTE  [name] Date of Birth:  Apr 12, 1956 Medical Record Number:  [id number]   Referring MD: Brian Henry, OD  Reason for Referral: Right facial weakness  HPI: Brian Henry is a 64 year old African male from [place] who is seen today for initial office consultation visit.  History is obtained from the patient and review of electronic medical records and reviewed pertinent available imaging films in PACS.  He has past medical history of diabetes hypertension and hemorrhagic left basal ganglia infarct he states he noticed a right facial weakness about a year ago.  He saw his primary care physician at [facility] and unfortunately I do not have access to his records today but he was diagnosed with Bell's palsy.  He describes inability to frown close his eyes or vessel.  He had trouble chewing and eating.  He noticed some initial improvement but the last couple of months he has been bothered by gritty sensation in his eyes as well as at times he has trouble opening his eyes.  Is also noticed that interestingly when he is chewing food his eyes started watering.  He denies any numbness or increased weakness on his face.  He denies any hyperacusis or alteration of taste.  He has had no recent stroke or TIA symptoms but review of imaging studies show that he had a hemorrhagic left basal ganglia infarct documented in previous MRI from 03/18/2017 which was felt to be chronic at that stage.  Patient denies any focal symptoms suggestive of stroke related to that location in the past.  He does take aspirin and states his blood pressure and diabetes are under good control though I do not have any recent labs to look at today.  ROS:   14 system review of systems is positive for facial weakness, watering of the eyes while chewing.  Dryness of the eyes, difficulty opening eye and all other systems negative PMH:   Past Medical History:  Diagnosis Date  . Anemia   . Diabetes mellitus without complication (HCC)   . Hypertension     Social History:  Social History   Socioeconomic History  . Marital status: Divorced    Spouse name: Not on file  . Number of children: 8  . Years of education: Not on file  . Highest education level: Not on file  Occupational History  . Occupation: Advice worker    Comment: full time  Tobacco Use  . Smoking status: Never Smoker  . Smokeless tobacco: Never Used  Vaping Use  . Vaping Use: Never used  Substance and Sexual Activity  . Alcohol use: No  . Drug use: No  . Sexual activity: Yes  Other Topics Concern  . Not on file  Social History Narrative   Lives with girlfriend, Synetta Fail   Right Handed   Social Determinants of Health   Financial Resource Strain: Not on file  Food Insecurity: Not on file  Transportation Needs: Not on file  Physical Activity: Not on file  Stress: Not on file  Social Connections: Not on file  Intimate Partner Violence: Not on file    Medications:   Current Outpatient Medications on File Prior to Visit  Medication Sig Dispense Refill  . amLODipine (NORVASC) 5 MG tablet Take 5 mg by mouth daily.    Marland Kitchen aspirin EC 325 MG tablet Take 1 tablet (325 mg total) by mouth  daily. 30 tablet 1  . carboxymethylcellulose (REVIVE TEARS) 0.5 % SOLN Place 2 drops into the right eye 3 (three) times daily. 15 mL 0  . empagliflozin (JARDIANCE) 10 MG TABS tablet Take 10 mg by mouth daily.    . hydrocortisone (ANUSOL-HC) 25 MG suppository Place 1 suppository (25 mg total) rectally 2 (two) times daily. 14 suppository 0  . Insulin Degludec-Liraglutide (XULTOPHY) 100-3.6 UNIT-MG/ML SOPN Inject 28 Units into the skin daily.     . Iron-FA-B Cmp-C-Biot-Probiotic (FUSION PLUS) CAPS Take 1 capsule by mouth daily.    Marland Kitchen ketotifen (ZADITOR) 0.025 % ophthalmic solution Place 1 drop into both eyes 2 (two) times daily as needed (eye relief).    Marland Kitchen  losartan-hydrochlorothiazide (HYZAAR) 50-12.5 MG tablet Take 1 tablet by mouth daily.    . metFORMIN (GLUCOPHAGE) 500 MG tablet Take 500 mg by mouth daily with breakfast.    . polyethylene glycol powder (GLYCOLAX/MIRALAX) powder Take 17 g by mouth daily. 255 g 3   Current Facility-Administered Medications on File Prior to Visit  Medication Dose Route Frequency Provider Last Rate Last Admin  . 0.9 %  sodium chloride infusion  500 mL Intravenous Continuous Armbruster, Carlota Raspberry, MD        Allergies:  No Known Allergies  Physical Exam General: well developed, well nourished middle-aged African male, seated, in no evident distress Head: head normocephalic and atraumatic.   Neck: supple with no carotid or supraclavicular bruits Cardiovascular: regular rate and rhythm, no murmurs Musculoskeletal: no deformity Skin:  no rash/petichiae Vascular:  Normal pulses all extremities  Neurologic Exam Mental Status: Awake and fully alert. Oriented to place and time. Recent and remote memory intact. Attention span, concentration and fund of knowledge appropriate. Mood and affect appropriate.  Cranial Nerves: Fundoscopic exam reveals sharp disc margins. Pupils equal, briskly reactive to light. Extraocular movements full without nystagmus. Visual fields full to confrontation. Hearing intact. Facial sensation intact.  Right peripheral 7th nerve weakness with weakness involving forehead muscles, eye closure, cheek.  Unable to bury his eyelashes with forceful closure.  Right eye is dry compared to the left.  Doll's phenomena is negative.  No visible twitchings of fasciculations on the face around the eyes.  Tongue, palate moves normally and symmetrically.  Motor: Normal bulk and tone. Normal strength in all tested extremity muscles. Sensory.: intact to touch , pinprick , position and vibratory sensation.  Coordination: Rapid alternating movements normal in all extremities. Finger-to-nose and heel-to-shin performed  accurately bilaterally. Gait and Station: Arises from chair without difficulty. Stance is normal. Gait demonstrates normal stride length and balance . Able to heel, toe and tandem walk with only slight difficulty.  Reflexes: 1+ and symmetric. Toes downgoing.       ASSESSMENT: 64 year old African male with history of right-sided Bell's palsy since 1 year ago with some recent symptoms of watering of eyes and irritation during chewing likely due to crocodile tears which is a late effect of Bell's palsy.  Remote history of hemorrhagic left basal ganglia infarct more than 3 years ago with no other recurrent neurovascular symptoms.  Vascular risk factors of diabetes hypertension and cerebrovascular disease    PLAN: I had a long discussion with the patient with regards to his right-sided Bell's palsy and late effects like crocodile tears and some dryness in his right eye and recommend he start taking artificial tears liberally in the right eye.  He has history of remote left basal ganglia infarct on previous MRI in 2018 and so recommend  follow-up MRI scan of the brain as well as carotid ultrasound, lipid profile and hemoglobin A1c.  Continue aspirin 81 mg daily for stroke prevention maintain aggressive risk factor modification with strict control of hypertension with blood pressure goal below 130/90, lipids with LDL cholesterol goal below 70 mg percent and diabetes with hemoglobin A1c goal below 6.5%.  He was also encouraged to eat a healthy diet and to be active and exercise regularly and lose weight.  He will return for follow-up in the future in 3 months or call earlier if necessary.  Greater than 50% time during this 45-minute consultation visit was spent in counseling and coordination of care about his Bell's palsy and remote stroke and answering questions. Antony Contras, MD Note: This document was prepared with digital dictation and possible smart phrase technology. Any transcriptional errors that result  from this process are unintentional.

## 2020-08-31 NOTE — Patient Instructions (Signed)
I had a long discussion with the patient with regards to his right-sided Bell's palsy and late effects like crocodile tears and some dryness in his right eye and recommend he start taking artificial tears liberally in the right eye.  He has history of remote left basal ganglia infarct on previous MRI in 2018 and so recommend follow-up MRI scan of the brain as well as carotid ultrasound, lipid profile and hemoglobin A1c.  Continue aspirin 81 mg daily for stroke prevention maintain aggressive risk factor modification with strict control of hypertension with blood pressure goal below 130/90, lipids with LDL cholesterol goal below 70 mg percent and diabetes with hemoglobin A1c goal below 6.5%.  He was also encouraged to eat a healthy diet and to be active and exercise regularly and lose weight.  He will return for follow-up in the future in 3 months or call earlier if necessary.  Bell Palsy, Adult  Bell palsy is a short-term inability to move muscles in part of the face. The inability to move (paralysis) results from inflammation or compression of the facial nerve, which travels along the skull and under the ear to the side of the face (7th cranial nerve). This nerve is responsible for facial movements that include blinking, closing the eyes, smiling, and frowning. What are the causes? The exact cause of this condition is not known. It may be caused by an infection from a virus, such as the chickenpox (herpes zoster), Epstein-Barr, or mumps virus. What increases the risk? You are more likely to develop this condition if:  You are pregnant.  You have diabetes.  You have had a recent infection in your nose, throat, or airways (upper respiratory infection).  You have a weakened body defense system (immune system).  You have had a facial injury, such as a fracture.  You have a family history of Bell palsy. What are the signs or symptoms? Symptoms of this condition include:  Weakness on one side of  the face.  Drooping eyelid and corner of the mouth.  Excessive tearing in one eye.  Difficulty closing the eyelid.  Dry eye.  Drooling.  Dry mouth.  Changes in taste.  Change in facial appearance.  Pain behind one ear.  Ringing in one or both ears.  Sensitivity to sound in one ear.  Facial twitching.  Headache.  Impaired speech.  Dizziness.  Difficulty eating or drinking. Most of the time, only one side of the face is affected. Rarely, Bell palsy affects the whole face. How is this diagnosed? This condition is diagnosed based on:  Your symptoms.  Your medical history.  A physical exam. You may also have to see health care providers who specialize in disorders of the nerves (neurologist) or diseases and conditions of the eye (ophthalmologist). You may have tests, such as:  A test to check for nerve damage (electromyogram).  Imaging studies, such as CT or MRI scans.  Blood tests. How is this treated? This condition affects every person differently. Sometimes symptoms go away without treatment within a couple weeks. If treatment is needed, it varies from person to person. The goal of treatment is to reduce inflammation and protect the eye from damage. Treatment for Bell palsy may include:  Medicines, such as: ? Steroids to reduce swelling and inflammation. ? Antiviral drugs. ? Pain relievers, including aspirin, acetaminophen, or ibuprofen.  Eye drops or ointment to keep your eye moist.  Eye protection, if you cannot close your eye.  Exercises or massage to regain muscle  strength and function (physical therapy). Follow these instructions at home:   Take over-the-counter and prescription medicines only as told by your health care provider.  If your eye is affected: ? Keep your eye moist with eye drops or ointment as told by your health care provider. ? Follow instructions for eye care and protection as told by your health care provider.  Do any  physical therapy exercises as told by your health care provider.  Keep all follow-up visits as told by your health care provider. This is important. Contact a health care provider if:  You have a fever.  Your symptoms do not get better within 2-3 weeks, or your symptoms get worse.  Your eye is red, irritated, or painful.  You have new symptoms. Get help right away if:  You have weakness or numbness in a part of your body other than your face.  You have trouble swallowing.  You develop neck pain or stiffness.  You develop dizziness or shortness of breath. Summary  Bell palsy is a short-term inability to move muscles in part of the face. The inability to move (paralysis) results from inflammation or compression of the facial nerve.  This condition affects every person differently. Sometimes symptoms go away without treatment within a couple weeks.  If treatment is needed, it varies from person to person. The goal of treatment is to reduce inflammation and protect the eye from damage.  Contact your health care provider if your symptoms do not get better within 2-3 weeks, or your symptoms get worse. This information is not intended to replace advice given to you by your health care provider. Make sure you discuss any questions you have with your health care provider. Document Revised: 08/02/2017 Document Reviewed: 10/23/2016 Elsevier Patient Education  2020 [employer].

## 2020-09-01 ENCOUNTER — Telehealth: Payer: Self-pay | Admitting: Emergency Medicine

## 2020-09-01 ENCOUNTER — Other Ambulatory Visit: Payer: Self-pay | Admitting: Neurology

## 2020-09-01 LAB — LIPID PANEL
Chol/HDL Ratio: 5.4 ratio — ABNORMAL HIGH (ref 0.0–5.0)
Cholesterol, Total: 221 mg/dL — ABNORMAL HIGH (ref 100–199)
HDL: 41 mg/dL (ref >39)
LDL Chol Calc (NIH): 157 mg/dL — ABNORMAL HIGH (ref 0–99)
Triglycerides: 125 mg/dL (ref 0–149)
VLDL Cholesterol Cal: 23 mg/dL (ref 5–40)

## 2020-09-01 LAB — HEMOGLOBIN A1C
Est. average glucose Bld gHb Est-mCnc: 169 mg/dL
Hgb A1c MFr Bld: 7.5 % — ABNORMAL HIGH (ref 4.8–5.6)

## 2020-09-01 MED ORDER — ATORVASTATIN CALCIUM 40 MG PO TABS: 40.0000 mg | ORAL_TABLET | Freq: Every day | ORAL | 3 refills | Status: AC

## 2020-09-01 NOTE — Progress Notes (Signed)
Kindly inform the patient that his cholesterol profile is not satisfactory and I have prescribed him Lipitor 40 mg daily to be picked up from his pharmacy.  He also needs to contact his primary care physician for tighter control of diabetes as his A1c was also not satisfactory.

## 2020-09-01 NOTE — Telephone Encounter (Signed)
Called patient and LVM concerning to pick up his prescription in regards to the blood work results as well as to please call his PCP regarding control of his diabetes  Left office number to call back if he had any additional questions.

## 2020-09-01 NOTE — Telephone Encounter (Signed)
-----   Message from Micki Riley, MD sent at 09/01/2020 10:31 AM EST ----- Kindly inform the patient that his cholesterol profile is not satisfactory and I have prescribed him Lipitor 40 mg daily to be picked up from his pharmacy.  He also needs to contact his primary care physician for tighter control of diabetes as his A1c was also not satisfactory.

## 2020-09-06 ENCOUNTER — Telehealth: Payer: Self-pay | Admitting: Neurology

## 2020-09-06 NOTE — Telephone Encounter (Signed)
spoke to the patient he states he is going to give me a call back on Monday 09/12/20  [employer] auth: NPR Ref # [id number]

## 2020-09-21 ENCOUNTER — Ambulatory Visit (HOSPITAL_COMMUNITY): Admission: RE | Admit: 2020-09-21 | Payer: BC Managed Care – PPO | Source: Ambulatory Visit

## 2020-11-22 ENCOUNTER — Ambulatory Visit: Payer: BC Managed Care – PPO | Admitting: Neurology

## 2020-11-28 DIAGNOSIS — E1169 Type 2 diabetes mellitus with other specified complication: Secondary | ICD-10-CM | POA: Diagnosis not present

## 2020-11-28 DIAGNOSIS — I10 Essential (primary) hypertension: Secondary | ICD-10-CM | POA: Diagnosis not present

## 2020-11-28 DIAGNOSIS — G510 Bell's palsy: Secondary | ICD-10-CM | POA: Diagnosis not present

## 2020-11-28 DIAGNOSIS — E782 Mixed hyperlipidemia: Secondary | ICD-10-CM | POA: Diagnosis not present

## 2021-03-24 DIAGNOSIS — E1169 Type 2 diabetes mellitus with other specified complication: Secondary | ICD-10-CM | POA: Diagnosis not present

## 2021-03-24 DIAGNOSIS — I10 Essential (primary) hypertension: Secondary | ICD-10-CM | POA: Diagnosis not present

## 2021-03-24 DIAGNOSIS — E785 Hyperlipidemia, unspecified: Secondary | ICD-10-CM | POA: Diagnosis not present

## 2021-03-27 DIAGNOSIS — E1169 Type 2 diabetes mellitus with other specified complication: Secondary | ICD-10-CM | POA: Diagnosis not present

## 2021-03-27 DIAGNOSIS — G510 Bell's palsy: Secondary | ICD-10-CM | POA: Diagnosis not present

## 2021-03-27 DIAGNOSIS — I10 Essential (primary) hypertension: Secondary | ICD-10-CM | POA: Diagnosis not present

## 2021-03-27 DIAGNOSIS — E785 Hyperlipidemia, unspecified: Secondary | ICD-10-CM | POA: Diagnosis not present

## 2021-06-01 DIAGNOSIS — E1169 Type 2 diabetes mellitus with other specified complication: Secondary | ICD-10-CM | POA: Diagnosis not present

## 2021-06-01 DIAGNOSIS — I10 Essential (primary) hypertension: Secondary | ICD-10-CM | POA: Diagnosis not present

## 2021-06-01 DIAGNOSIS — E782 Mixed hyperlipidemia: Secondary | ICD-10-CM | POA: Diagnosis not present

## 2021-07-10 DIAGNOSIS — I10 Essential (primary) hypertension: Secondary | ICD-10-CM | POA: Diagnosis not present

## 2021-07-10 DIAGNOSIS — E1169 Type 2 diabetes mellitus with other specified complication: Secondary | ICD-10-CM | POA: Diagnosis not present
